# Patient Record
Sex: Female | Born: 1945
Health system: Southern US, Community
[De-identification: ages and names within clinical notes are randomized; demographics above are authoritative.]

## PROBLEM LIST (undated history)

## (undated) DIAGNOSIS — J309 Allergic rhinitis, unspecified: Secondary | ICD-10-CM

## (undated) DIAGNOSIS — L570 Actinic keratosis: Secondary | ICD-10-CM

## (undated) DIAGNOSIS — E785 Hyperlipidemia, unspecified: Secondary | ICD-10-CM

## (undated) DIAGNOSIS — I10 Essential (primary) hypertension: Secondary | ICD-10-CM

## (undated) DIAGNOSIS — M810 Age-related osteoporosis without current pathological fracture: Secondary | ICD-10-CM

## (undated) DIAGNOSIS — H9190 Unspecified hearing loss, unspecified ear: Secondary | ICD-10-CM

## (undated) HISTORY — DX: Allergic rhinitis, unspecified: J30.9

## (undated) HISTORY — DX: Age-related osteoporosis without current pathological fracture: M81.0

## (undated) HISTORY — PX: COLONOSCOPY: SHX174

## (undated) HISTORY — DX: Unspecified hearing loss, unspecified ear: H91.90

## (undated) HISTORY — PX: VAGINAL HYSTERECTOMY: SUR661

## (undated) HISTORY — DX: Hyperlipidemia, unspecified: E78.5

## (undated) HISTORY — DX: Essential (primary) hypertension: I10

## (undated) HISTORY — DX: Actinic keratosis: L57.0

---

## 1999-04-18 ENCOUNTER — Other Ambulatory Visit: Admission: RE | Admit: 1999-04-18 | Discharge: 1999-04-18 | Payer: Self-pay | Admitting: Obstetrics and Gynecology

## 1999-06-20 ENCOUNTER — Encounter: Admission: RE | Admit: 1999-06-20 | Discharge: 1999-06-20 | Payer: Self-pay | Admitting: Obstetrics and Gynecology

## 2000-04-27 ENCOUNTER — Other Ambulatory Visit: Admission: RE | Admit: 2000-04-27 | Discharge: 2000-04-27 | Payer: Self-pay | Admitting: Obstetrics and Gynecology

## 2000-07-02 ENCOUNTER — Encounter: Payer: Self-pay | Admitting: Obstetrics and Gynecology

## 2000-07-02 ENCOUNTER — Encounter: Admission: RE | Admit: 2000-07-02 | Discharge: 2000-07-02 | Payer: Self-pay | Admitting: Obstetrics and Gynecology

## 2001-07-04 ENCOUNTER — Encounter: Payer: Self-pay | Admitting: Obstetrics and Gynecology

## 2001-07-04 ENCOUNTER — Encounter: Admission: RE | Admit: 2001-07-04 | Discharge: 2001-07-04 | Payer: Self-pay | Admitting: Obstetrics and Gynecology

## 2001-07-06 ENCOUNTER — Other Ambulatory Visit: Admission: RE | Admit: 2001-07-06 | Discharge: 2001-07-06 | Payer: Self-pay | Admitting: Obstetrics and Gynecology

## 2002-05-26 ENCOUNTER — Encounter: Payer: Self-pay | Admitting: *Deleted

## 2002-05-26 ENCOUNTER — Ambulatory Visit (HOSPITAL_COMMUNITY): Admission: RE | Admit: 2002-05-26 | Discharge: 2002-05-26 | Payer: Self-pay | Admitting: *Deleted

## 2002-07-27 ENCOUNTER — Encounter: Payer: Self-pay | Admitting: Obstetrics and Gynecology

## 2002-07-27 ENCOUNTER — Encounter: Admission: RE | Admit: 2002-07-27 | Discharge: 2002-07-27 | Payer: Self-pay | Admitting: Obstetrics and Gynecology

## 2003-07-20 ENCOUNTER — Encounter (INDEPENDENT_AMBULATORY_CARE_PROVIDER_SITE_OTHER): Payer: Self-pay | Admitting: Specialist

## 2003-07-20 ENCOUNTER — Ambulatory Visit (HOSPITAL_COMMUNITY): Admission: RE | Admit: 2003-07-20 | Discharge: 2003-07-20 | Payer: Self-pay | Admitting: *Deleted

## 2003-07-31 ENCOUNTER — Encounter: Admission: RE | Admit: 2003-07-31 | Discharge: 2003-07-31 | Payer: Self-pay | Admitting: Obstetrics and Gynecology

## 2004-05-08 ENCOUNTER — Emergency Department (HOSPITAL_COMMUNITY): Admission: EM | Admit: 2004-05-08 | Discharge: 2004-05-08 | Payer: Self-pay | Admitting: Emergency Medicine

## 2004-08-19 ENCOUNTER — Encounter: Admission: RE | Admit: 2004-08-19 | Discharge: 2004-08-19 | Payer: Self-pay | Admitting: Obstetrics and Gynecology

## 2004-09-07 HISTORY — PX: CARPAL TUNNEL RELEASE: SHX101

## 2005-03-27 ENCOUNTER — Emergency Department (HOSPITAL_COMMUNITY): Admission: EM | Admit: 2005-03-27 | Discharge: 2005-03-27 | Payer: Self-pay | Admitting: Emergency Medicine

## 2005-07-23 ENCOUNTER — Ambulatory Visit (HOSPITAL_COMMUNITY): Admission: RE | Admit: 2005-07-23 | Discharge: 2005-07-23 | Payer: Self-pay | Admitting: Orthopedic Surgery

## 2005-07-23 ENCOUNTER — Ambulatory Visit (HOSPITAL_BASED_OUTPATIENT_CLINIC_OR_DEPARTMENT_OTHER): Admission: RE | Admit: 2005-07-23 | Discharge: 2005-07-23 | Payer: Self-pay | Admitting: Orthopedic Surgery

## 2005-10-13 ENCOUNTER — Encounter: Admission: RE | Admit: 2005-10-13 | Discharge: 2005-10-13 | Payer: Self-pay | Admitting: Obstetrics and Gynecology

## 2006-10-14 ENCOUNTER — Encounter: Admission: RE | Admit: 2006-10-14 | Discharge: 2006-10-14 | Payer: Self-pay | Admitting: Obstetrics and Gynecology

## 2007-10-17 ENCOUNTER — Encounter: Admission: RE | Admit: 2007-10-17 | Discharge: 2007-10-17 | Payer: Self-pay | Admitting: Obstetrics and Gynecology

## 2008-10-17 ENCOUNTER — Encounter: Admission: RE | Admit: 2008-10-17 | Discharge: 2008-10-17 | Payer: Self-pay | Admitting: Obstetrics and Gynecology

## 2009-12-03 ENCOUNTER — Encounter: Admission: RE | Admit: 2009-12-03 | Discharge: 2009-12-03 | Payer: Self-pay | Admitting: Obstetrics and Gynecology

## 2010-11-07 ENCOUNTER — Other Ambulatory Visit: Payer: Self-pay | Admitting: Obstetrics and Gynecology

## 2010-11-07 DIAGNOSIS — Z1231 Encounter for screening mammogram for malignant neoplasm of breast: Secondary | ICD-10-CM

## 2010-12-05 ENCOUNTER — Ambulatory Visit
Admission: RE | Admit: 2010-12-05 | Discharge: 2010-12-05 | Disposition: A | Discharge status: Home / Self Care | Payer: BC Managed Care – PPO | Source: Ambulatory Visit | Attending: Obstetrics and Gynecology | Admitting: Obstetrics and Gynecology

## 2010-12-05 DIAGNOSIS — Z1231 Encounter for screening mammogram for malignant neoplasm of breast: Secondary | ICD-10-CM

## 2011-01-23 NOTE — Op Note (Signed)
   NAME:  Ariel Walton, Ariel Walton                         ACCOUNT NO.:  [id number]   MEDICAL RECORD NO.:  [id number]                   PATIENT TYPE:  AMB   LOCATION:  ENDO                                 FACILITY:  [facility]   PHYSICIAN:  Georgiana Spinner, M.D.                 DATE OF BIRTH:  03/03/1946   DATE OF PROCEDURE:  07/20/2003  DATE OF DISCHARGE:                                 OPERATIVE REPORT   PROCEDURE:  Upper endoscopy.   INDICATIONS:  GERD.   ANESTHESIA:  Demerol 50 mg, Versed 5 mg.   DESCRIPTION OF PROCEDURE:  With the patient mildly sedated in the left  lateral decubitus position, the Olympus videoscopic endoscope was inserted  in the mouth and passed under direct vision through the esophagus, which  appeared normal except that there was a question of a short segment of  Barrett's that was biopsied and photographed.  We entered into the stomach.  The fundus, body, antrum, duodenal bulb, and second portion of the duodenum  all appeared normal.  From this point the endoscope was slowly withdrawn,  taking circumferential views of the duodenal mucosa until the endoscope was  placed back into the stomach and placed in retroflexion to view the stomach  from below.  The endoscope was straightened and withdrawn taking  circumferential views of the remaining gastric and esophageal mucosa.  The  patient's vital signs and pulse oximetry remained stable.  The patient  tolerated the procedure well without any apparent complications.   FINDINGS:  Question of Barrett's esophagus, biopsied.   Await biopsy report.  The patient will call me for results and follow up  with me as an outpatient.  Proceed to colonoscopy as planned.                                               Georgiana Spinner, M.D.    GMO/MEDQ  D:  07/20/2003  T:  07/20/2003  Job:  [id number]

## 2011-01-23 NOTE — Op Note (Signed)
Ariel Walton, BASINSKI               ACCOUNT NO.:  [id number]   MEDICAL RECORD NO.:  [id number]          PATIENT TYPE:  AMB   LOCATION:  DSC                          FACILITY:  [facility]   PHYSICIAN:  Rodney A. Mortenson, M.D.DATE OF BIRTH:  31-Jan-1946   DATE OF PROCEDURE:  07/23/2005  DATE OF DISCHARGE:                                 OPERATIVE REPORT   PREOPERATIVE DIAGNOSIS:  Right carpal tunnel.   POSTOPERATIVE DIAGNOSIS:  Right carpal tunnel.   OPERATION PERFORMED:  Release of right carpal tunnel.   SURGEON:  Lenard Galloway. Chaney Malling, M.D.   ANESTHESIA:  Axillary block and general.   DESCRIPTION OF PROCEDURE:  Patient placed on the operating table with  pneumatic tourniquet about the right upper arm.  After satisfactory  anesthesia, the right upper extremity was prepped with DuraPrep and draped  out in the usual manner.  The arm was then wrapped out with an Esmarch and  tourniquet was elevated.  Loupe magnification was used throughout the  operative procedure.  A lazy-S incision starting in the midpalmar space and  carried proximally to the volar wrist crease.  Skin edges were retracted.  The fascia of the forearm was identified and isolated.  The median nerve was  identified and protected.  The large Mayo scissor was placed through the  small incision underneath the transverse ligament but above the median nerve  to protect the median nerve.  Under direct vision, the transverse carpal  ligament was then released off the ulnar border of the carpal canal out into  the midpalmar space.  Complete decompression of the nerve was achieved.  No  significant space occupying lesions were seen.  The wound was then bathed in  Marcaine and the skin edges were closed with 4-0 nylon suture.  Large bulky  compressive dressing was applied.  The patient was then transferred to the  recovery room in excellent condition.  Technically this procedure went  extremely well.   FOLLOW UP:  1.  To my office  next week.  2.  Percocet for pain.           ______________________________  Lenard Galloway. Chaney Malling, M.D.     RAM/MEDQ  D:  07/23/2005  T:  07/23/2005  Job:  [id number]

## 2011-01-23 NOTE — Op Note (Signed)
   NAME:  Ariel Walton, Ariel Walton                         ACCOUNT NO.:  [id number]   MEDICAL RECORD NO.:  [id number]                   PATIENT TYPE:  AMB   LOCATION:  ENDO                                 [facility]ACILITY:  [facility]   PHYSICIAN:  Georgiana Spinner, M.D.                 DATE OF BIRTH:  Oct 19, 1945   DATE OF PROCEDURE:  DATE OF DISCHARGE:                                 OPERATIVE REPORT   PROCEDURE:  Colonoscopy.   ANESTHESIA:  Demerol 20 mg, Versed 2.   INDICATIONS:  Cancer screening.   DESCRIPTION OF PROCEDURE:  With the patient mildly sedated and in the left  lateral decubitus position, the Olympus videoscopic colonoscope was inserted  in the rectum and passed under a tortuous distal sigmoid colon.  Eventually  was able to reach the cecum, identified by ileocecal valve and appendiceal  orifice, both of which were photographed.  From this point,  the colonoscope  was slowly withdrawn, taking circumferential views of the colonic mucosa,  stopping only in the rectum which appeared normal on direct and showed some  hemorrhoid on retroflexed view.  The endoscope was straightened and  withdrawn.  The patient's vital signs and pulse oximetry remained stable.  The patient tolerated the procedure well without apparent complications.   FINDINGS:  Internal hemorrhoids; otherwise unremarkable colonoscopic  examination to the cecum.   PLAN:  See endoscopy for further details.                                               Georgiana Spinner, M.D.    GMO/MEDQ  D:  07/20/2003  T:  07/20/2003  Job:  [id number]

## 2011-12-23 ENCOUNTER — Other Ambulatory Visit: Payer: Self-pay | Admitting: Internal Medicine

## 2011-12-23 DIAGNOSIS — R102 Pelvic and perineal pain: Secondary | ICD-10-CM

## 2012-01-08 ENCOUNTER — Ambulatory Visit
Admission: RE | Admit: 2012-01-08 | Discharge: 2012-01-08 | Disposition: A | Discharge status: Home / Self Care | Payer: BC Managed Care – PPO | Source: Ambulatory Visit | Attending: Internal Medicine | Admitting: Internal Medicine

## 2012-01-08 ENCOUNTER — Ambulatory Visit
Admission: RE | Admit: 2012-01-08 | Discharge: 2012-01-08 | Disposition: A | Discharge status: Home / Self Care | Payer: Medicare Other | Source: Ambulatory Visit | Attending: Internal Medicine | Admitting: Internal Medicine

## 2012-01-08 DIAGNOSIS — R102 Pelvic and perineal pain: Secondary | ICD-10-CM

## 2013-06-27 ENCOUNTER — Encounter: Payer: Self-pay | Admitting: Internal Medicine

## 2013-08-28 ENCOUNTER — Ambulatory Visit (AMBULATORY_SURGERY_CENTER): Payer: Self-pay

## 2013-08-28 VITALS — Ht 63.0 in | Wt 131.0 lb

## 2013-08-28 DIAGNOSIS — Z1211 Encounter for screening for malignant neoplasm of colon: Secondary | ICD-10-CM

## 2013-08-28 MED ORDER — SUPREP BOWEL PREP KIT 17.5-3.13-1.6 GM/177ML PO SOLN: 1.0000 | Freq: Once | ORAL | Status: DC

## 2013-09-13 ENCOUNTER — Ambulatory Visit (AMBULATORY_SURGERY_CENTER): Payer: Medicare HMO | Admitting: Internal Medicine

## 2013-09-13 ENCOUNTER — Encounter: Payer: Self-pay | Admitting: Internal Medicine

## 2013-09-13 VITALS — BP 105/56 | HR 78 | Temp 98.4°F | Resp 18 | Ht 63.0 in | Wt 131.0 lb

## 2013-09-13 DIAGNOSIS — Z1211 Encounter for screening for malignant neoplasm of colon: Secondary | ICD-10-CM

## 2013-09-13 MED ORDER — SODIUM CHLORIDE 0.9 % IV SOLN: 500.0000 mL | INTRAVENOUS | Status: DC

## 2013-09-13 NOTE — Progress Notes (Signed)
A/ox3 pleased with MAC, report to Karol RN 

## 2013-09-13 NOTE — Op Note (Signed)
[place]  [address]. [place], [address]   COLONOSCOPY PROCEDURE REPORT  PATIENT: Ariel Walton, Ariel Walton  MR#: [id number] BIRTHDATE: May 06, 1946 , 67  yrs. old GENDER: Female ENDOSCOPIST: Gatha Mayer, MD, [facility] REFERRED OJ:[id number] Delight Hoh, M.D. PROCEDURE DATE:  09/13/2013 PROCEDURE:   Colonoscopy, screening First Screening Colonoscopy - Avg.  risk and is 50 yrs.  old or older - No.  Prior Negative Screening - Now for repeat screening. 10 or more years since last screening  History of Adenoma - Now for follow-up colonoscopy & has been > or = to 3 yrs.  N/A  Polyps Removed Today? No.  Recommend repeat exam, <10 yrs? No. ASA CLASS:   Class II INDICATIONS:average risk screening and Last colonoscopy performed 10 years ago. MEDICATIONS: Propofol (Diprivan) 180 mg IV, MAC sedation, administered by CRNA, and These medications were titrated to patient response per physician's verbal order  DESCRIPTION OF PROCEDURE:   After the risks benefits and alternatives of the procedure were thoroughly explained, informed consent was obtained.  A digital rectal exam revealed no abnormalities of the rectum.   The LB [id number]  endoscope was introduced through the anus and advanced to the cecum, which was identified by both the appendix and ileocecal valve. No adverse events experienced.   The quality of the prep was excellent using Suprep  The instrument was then slowly withdrawn as the colon was fully examined.      COLON FINDINGS: A normal appearing cecum, ileocecal valve, and appendiceal orifice were identified.  The ascending, hepatic flexure, transverse, splenic flexure, descending, sigmoid colon and rectum appeared unremarkable.  No polyps or cancers were seen.   A right colon retroflexion was performed.  Retroflexed views revealed no abnormalities. The time to cecum=4 minutes 27 seconds. Withdrawal time=8 minutes 11 seconds.  The scope was withdrawn and the  procedure completed. COMPLICATIONS: There were no complications.  ENDOSCOPIC IMPRESSION: Normal colonoscopy - excellent prep second or third screening exam  RECOMMENDATIONS: Follow-up as needed, she has had at least 2 colonoscopies w/o polyps and would be 77 in 10 years so no routine recall needed most likely - reassess with PCP at that time and examine for signs/symptoms as needed   eSigned:  Gatha Mayer, MD, [facility] 09/13/2013 10:50 AM   cc: Alden Server, MD and The Patient

## 2013-09-13 NOTE — Patient Instructions (Signed)
Normal Colonoscopy  YOU HAD AN ENDOSCOPIC PROCEDURE TODAY AT [facility]: Refer to the procedure report that was given to you for any specific questions about what was found during the examination.  If the procedure report does not answer your questions, please call your gastroenterologist to clarify.  If you requested that your care partner not be given the details of your procedure findings, then the procedure report has been included in a sealed envelope for you to review at your convenience later.  YOU SHOULD EXPECT: Some feelings of bloating in the abdomen. Passage of more gas than usual.  Walking can help get rid of the air that was put into your GI tract during the procedure and reduce the bloating. If you had a lower endoscopy (such as a colonoscopy or flexible sigmoidoscopy) you may notice spotting of blood in your stool or on the toilet paper. If you underwent a bowel prep for your procedure, then you may not have a normal bowel movement for a few days.  DIET: Your first meal following the procedure should be a light meal and then it is ok to progress to your normal diet.  A half-sandwich or bowl of soup is an example of a  good first meal.  Heavy or fried foods are harder to digest and may make you feel nauseous or bloated.  Likewise meals heavy in dairy and vegetables can cause extra gas to form and this can also increase the bloating.  Drink plenty of fluids but you should avoid alcoholic beverages for 24 hours.  ACTIVITY: Your care partner should take you home directly after the procedure.  You should plan to take it easy, moving slowly for the rest of the day.  You can resume normal activity the day after the procedure however you should NOT DRIVE or use heavy machinery for 24 hours (because of the sedation medicines used during the test).    SYMPTOMS TO REPORT IMMEDIATELY: A gastroenterologist can be reached at any hour.  During normal business hours, 8:30 AM to 5:00 PM  Monday through Friday, call [phone].  After hours and on weekends, please call the GI answering service at [phone] who will take a message and have the physician on call contact you.   Following lower endoscopy (colonoscopy or flexible sigmoidoscopy):  Excessive amounts of blood in the stool  Significant tenderness or worsening of abdominal pains  Swelling of the abdomen that is new, acute  Fever of 100F or higher  FOLLOW UP: If any biopsies were taken you will be contacted by phone or by letter within the next 1-3 weeks.  Call your gastroenterologist if you have not heard about the biopsies in 3 weeks.  Our staff will call the home number listed on your records the next business day following your procedure to check on you and address any questions or concerns that you may have at that time regarding the information given to you following your procedure. This is a courtesy call and so if there is no answer at the home number and we have not heard from you through the emergency physician on call, we will assume that you have returned to your regular daily activities without incident.  SIGNATURES/CONFIDENTIALITY: You and/or your care partner have signed paperwork which will be entered into your electronic medical record.  These signatures attest to the fact that that the information above on your After Visit Summary has been reviewed and is understood.  Full responsibility of  the confidentiality of this discharge information lies with you and/or your care-partner. 

## 2013-09-14 ENCOUNTER — Telehealth: Payer: Self-pay | Admitting: *Deleted

## 2013-09-14 NOTE — Telephone Encounter (Signed)
  Follow up Call-  Call back number 09/13/2013  Post procedure Call Back phone  # [phone]  Permission to leave phone message No  comments DO NOT HAVE ANSWERING MACHINE     Patient questions:  Do you have a fever, pain , or abdominal swelling? no Pain Score  0 *  Have you tolerated food without any problems? yes  Have you been able to return to your normal activities? yes  Do you have any questions about your discharge instructions: Diet   no Medications  no Follow up visit  no  Do you have questions or concerns about your Care? no  Actions: * If pain score is 4 or above: No action needed, pain <4.

## 2013-11-17 ENCOUNTER — Other Ambulatory Visit: Payer: Self-pay | Admitting: Internal Medicine

## 2013-11-17 ENCOUNTER — Ambulatory Visit
Admission: RE | Admit: 2013-11-17 | Discharge: 2013-11-17 | Disposition: A | Discharge status: Home / Self Care | Payer: Medicare PPO | Source: Ambulatory Visit | Attending: Internal Medicine | Admitting: Internal Medicine

## 2013-11-17 DIAGNOSIS — S0990XA Unspecified injury of head, initial encounter: Secondary | ICD-10-CM

## 2013-11-27 ENCOUNTER — Other Ambulatory Visit: Payer: Self-pay | Admitting: Otolaryngology

## 2013-11-27 DIAGNOSIS — H905 Unspecified sensorineural hearing loss: Secondary | ICD-10-CM

## 2013-11-27 DIAGNOSIS — H903 Sensorineural hearing loss, bilateral: Secondary | ICD-10-CM

## 2013-12-04 ENCOUNTER — Ambulatory Visit
Admission: RE | Admit: 2013-12-04 | Discharge: 2013-12-04 | Disposition: A | Discharge status: Home / Self Care | Payer: Medicare PPO | Source: Ambulatory Visit | Attending: Otolaryngology | Admitting: Otolaryngology

## 2013-12-04 DIAGNOSIS — H905 Unspecified sensorineural hearing loss: Secondary | ICD-10-CM

## 2013-12-04 DIAGNOSIS — H903 Sensorineural hearing loss, bilateral: Secondary | ICD-10-CM

## 2014-10-04 DIAGNOSIS — R29898 Other symptoms and signs involving the musculoskeletal system: Secondary | ICD-10-CM | POA: Diagnosis not present

## 2015-02-19 DIAGNOSIS — Z01419 Encounter for gynecological examination (general) (routine) without abnormal findings: Secondary | ICD-10-CM | POA: Diagnosis not present

## 2015-02-19 DIAGNOSIS — Z1231 Encounter for screening mammogram for malignant neoplasm of breast: Secondary | ICD-10-CM | POA: Diagnosis not present

## 2015-04-08 DIAGNOSIS — M810 Age-related osteoporosis without current pathological fracture: Secondary | ICD-10-CM | POA: Diagnosis not present

## 2015-07-23 DIAGNOSIS — R300 Dysuria: Secondary | ICD-10-CM | POA: Diagnosis not present

## 2015-08-07 DIAGNOSIS — I10 Essential (primary) hypertension: Secondary | ICD-10-CM | POA: Diagnosis not present

## 2015-08-07 DIAGNOSIS — E559 Vitamin D deficiency, unspecified: Secondary | ICD-10-CM | POA: Diagnosis not present

## 2015-08-07 DIAGNOSIS — M810 Age-related osteoporosis without current pathological fracture: Secondary | ICD-10-CM | POA: Diagnosis not present

## 2015-08-07 DIAGNOSIS — E7800 Pure hypercholesterolemia, unspecified: Secondary | ICD-10-CM | POA: Diagnosis not present

## 2015-08-07 DIAGNOSIS — Z0000 Encounter for general adult medical examination without abnormal findings: Secondary | ICD-10-CM | POA: Diagnosis not present

## 2015-08-14 DIAGNOSIS — M810 Age-related osteoporosis without current pathological fracture: Secondary | ICD-10-CM | POA: Diagnosis not present

## 2015-08-14 DIAGNOSIS — I10 Essential (primary) hypertension: Secondary | ICD-10-CM | POA: Diagnosis not present

## 2015-08-14 DIAGNOSIS — E7800 Pure hypercholesterolemia, unspecified: Secondary | ICD-10-CM | POA: Diagnosis not present

## 2015-08-14 DIAGNOSIS — Z23 Encounter for immunization: Secondary | ICD-10-CM | POA: Diagnosis not present

## 2015-10-04 DIAGNOSIS — H9193 Unspecified hearing loss, bilateral: Secondary | ICD-10-CM | POA: Diagnosis not present

## 2015-10-04 DIAGNOSIS — L570 Actinic keratosis: Secondary | ICD-10-CM | POA: Diagnosis not present

## 2015-11-08 DIAGNOSIS — M810 Age-related osteoporosis without current pathological fracture: Secondary | ICD-10-CM | POA: Diagnosis not present

## 2015-11-22 DIAGNOSIS — T5991XA Toxic effect of unspecified gases, fumes and vapors, accidental (unintentional), initial encounter: Secondary | ICD-10-CM | POA: Diagnosis not present

## 2015-11-22 DIAGNOSIS — J069 Acute upper respiratory infection, unspecified: Secondary | ICD-10-CM | POA: Diagnosis not present

## 2016-02-07 DIAGNOSIS — M8580 Other specified disorders of bone density and structure, unspecified site: Secondary | ICD-10-CM | POA: Diagnosis not present

## 2016-02-07 DIAGNOSIS — E7800 Pure hypercholesterolemia, unspecified: Secondary | ICD-10-CM | POA: Diagnosis not present

## 2016-02-07 DIAGNOSIS — E559 Vitamin D deficiency, unspecified: Secondary | ICD-10-CM | POA: Diagnosis not present

## 2016-02-07 DIAGNOSIS — M859 Disorder of bone density and structure, unspecified: Secondary | ICD-10-CM | POA: Diagnosis not present

## 2016-02-07 DIAGNOSIS — Z0000 Encounter for general adult medical examination without abnormal findings: Secondary | ICD-10-CM | POA: Diagnosis not present

## 2016-02-07 DIAGNOSIS — I10 Essential (primary) hypertension: Secondary | ICD-10-CM | POA: Diagnosis not present

## 2016-02-07 DIAGNOSIS — N951 Menopausal and female climacteric states: Secondary | ICD-10-CM | POA: Diagnosis not present

## 2016-02-07 DIAGNOSIS — N390 Urinary tract infection, site not specified: Secondary | ICD-10-CM | POA: Diagnosis not present

## 2016-02-12 DIAGNOSIS — I10 Essential (primary) hypertension: Secondary | ICD-10-CM | POA: Diagnosis not present

## 2016-02-12 DIAGNOSIS — E7800 Pure hypercholesterolemia, unspecified: Secondary | ICD-10-CM | POA: Diagnosis not present

## 2016-02-12 DIAGNOSIS — Z0000 Encounter for general adult medical examination without abnormal findings: Secondary | ICD-10-CM | POA: Diagnosis not present

## 2016-02-12 DIAGNOSIS — M8580 Other specified disorders of bone density and structure, unspecified site: Secondary | ICD-10-CM | POA: Diagnosis not present

## 2016-02-25 DIAGNOSIS — D485 Neoplasm of uncertain behavior of skin: Secondary | ICD-10-CM | POA: Diagnosis not present

## 2016-02-25 DIAGNOSIS — H61002 Unspecified perichondritis of left external ear: Secondary | ICD-10-CM | POA: Diagnosis not present

## 2016-02-25 DIAGNOSIS — L409 Psoriasis, unspecified: Secondary | ICD-10-CM | POA: Diagnosis not present

## 2016-02-25 DIAGNOSIS — L570 Actinic keratosis: Secondary | ICD-10-CM | POA: Diagnosis not present

## 2016-03-11 DIAGNOSIS — Z130 Encounter for screening for diseases of the blood and blood-forming organs and certain disorders involving the immune mechanism: Secondary | ICD-10-CM | POA: Diagnosis not present

## 2016-03-11 DIAGNOSIS — Z01419 Encounter for gynecological examination (general) (routine) without abnormal findings: Secondary | ICD-10-CM | POA: Diagnosis not present

## 2016-03-11 DIAGNOSIS — Z1389 Encounter for screening for other disorder: Secondary | ICD-10-CM | POA: Diagnosis not present

## 2016-03-11 DIAGNOSIS — Z1231 Encounter for screening mammogram for malignant neoplasm of breast: Secondary | ICD-10-CM | POA: Diagnosis not present

## 2016-04-29 DIAGNOSIS — L570 Actinic keratosis: Secondary | ICD-10-CM | POA: Diagnosis not present

## 2016-06-02 DIAGNOSIS — M810 Age-related osteoporosis without current pathological fracture: Secondary | ICD-10-CM | POA: Diagnosis not present

## 2016-07-23 DIAGNOSIS — D485 Neoplasm of uncertain behavior of skin: Secondary | ICD-10-CM | POA: Diagnosis not present

## 2016-07-23 DIAGNOSIS — L821 Other seborrheic keratosis: Secondary | ICD-10-CM | POA: Diagnosis not present

## 2016-07-23 DIAGNOSIS — D1801 Hemangioma of skin and subcutaneous tissue: Secondary | ICD-10-CM | POA: Diagnosis not present

## 2016-07-23 DIAGNOSIS — D225 Melanocytic nevi of trunk: Secondary | ICD-10-CM | POA: Diagnosis not present

## 2016-07-23 DIAGNOSIS — L814 Other melanin hyperpigmentation: Secondary | ICD-10-CM | POA: Diagnosis not present

## 2016-07-23 DIAGNOSIS — L570 Actinic keratosis: Secondary | ICD-10-CM | POA: Diagnosis not present

## 2016-12-01 DIAGNOSIS — M810 Age-related osteoporosis without current pathological fracture: Secondary | ICD-10-CM | POA: Diagnosis not present

## 2016-12-30 DIAGNOSIS — R21 Rash and other nonspecific skin eruption: Secondary | ICD-10-CM | POA: Diagnosis not present

## 2016-12-30 DIAGNOSIS — L299 Pruritus, unspecified: Secondary | ICD-10-CM | POA: Diagnosis not present

## 2017-02-16 DIAGNOSIS — I10 Essential (primary) hypertension: Secondary | ICD-10-CM | POA: Diagnosis not present

## 2017-02-16 DIAGNOSIS — Z0000 Encounter for general adult medical examination without abnormal findings: Secondary | ICD-10-CM | POA: Diagnosis not present

## 2017-02-16 DIAGNOSIS — E559 Vitamin D deficiency, unspecified: Secondary | ICD-10-CM | POA: Diagnosis not present

## 2017-02-19 DIAGNOSIS — I10 Essential (primary) hypertension: Secondary | ICD-10-CM | POA: Diagnosis not present

## 2017-02-19 DIAGNOSIS — Z0000 Encounter for general adult medical examination without abnormal findings: Secondary | ICD-10-CM | POA: Diagnosis not present

## 2017-02-19 DIAGNOSIS — M810 Age-related osteoporosis without current pathological fracture: Secondary | ICD-10-CM | POA: Diagnosis not present

## 2017-02-19 DIAGNOSIS — E7800 Pure hypercholesterolemia, unspecified: Secondary | ICD-10-CM | POA: Diagnosis not present

## 2017-03-12 DIAGNOSIS — Z1389 Encounter for screening for other disorder: Secondary | ICD-10-CM | POA: Diagnosis not present

## 2017-03-12 DIAGNOSIS — Z130 Encounter for screening for diseases of the blood and blood-forming organs and certain disorders involving the immune mechanism: Secondary | ICD-10-CM | POA: Diagnosis not present

## 2017-03-12 DIAGNOSIS — Z1231 Encounter for screening mammogram for malignant neoplasm of breast: Secondary | ICD-10-CM | POA: Diagnosis not present

## 2017-03-12 DIAGNOSIS — Z01419 Encounter for gynecological examination (general) (routine) without abnormal findings: Secondary | ICD-10-CM | POA: Diagnosis not present

## 2017-03-23 DIAGNOSIS — M9902 Segmental and somatic dysfunction of thoracic region: Secondary | ICD-10-CM | POA: Diagnosis not present

## 2017-03-23 DIAGNOSIS — M9901 Segmental and somatic dysfunction of cervical region: Secondary | ICD-10-CM | POA: Diagnosis not present

## 2017-03-23 DIAGNOSIS — M5412 Radiculopathy, cervical region: Secondary | ICD-10-CM | POA: Diagnosis not present

## 2017-03-23 DIAGNOSIS — M546 Pain in thoracic spine: Secondary | ICD-10-CM | POA: Diagnosis not present

## 2017-03-29 DIAGNOSIS — M9902 Segmental and somatic dysfunction of thoracic region: Secondary | ICD-10-CM | POA: Diagnosis not present

## 2017-03-29 DIAGNOSIS — M5412 Radiculopathy, cervical region: Secondary | ICD-10-CM | POA: Diagnosis not present

## 2017-03-29 DIAGNOSIS — M9901 Segmental and somatic dysfunction of cervical region: Secondary | ICD-10-CM | POA: Diagnosis not present

## 2017-03-29 DIAGNOSIS — M546 Pain in thoracic spine: Secondary | ICD-10-CM | POA: Diagnosis not present

## 2017-04-07 DIAGNOSIS — M5412 Radiculopathy, cervical region: Secondary | ICD-10-CM | POA: Diagnosis not present

## 2017-04-07 DIAGNOSIS — M9901 Segmental and somatic dysfunction of cervical region: Secondary | ICD-10-CM | POA: Diagnosis not present

## 2017-04-07 DIAGNOSIS — M9902 Segmental and somatic dysfunction of thoracic region: Secondary | ICD-10-CM | POA: Diagnosis not present

## 2017-04-07 DIAGNOSIS — M546 Pain in thoracic spine: Secondary | ICD-10-CM | POA: Diagnosis not present

## 2017-04-08 DIAGNOSIS — M5412 Radiculopathy, cervical region: Secondary | ICD-10-CM | POA: Diagnosis not present

## 2017-04-08 DIAGNOSIS — M9901 Segmental and somatic dysfunction of cervical region: Secondary | ICD-10-CM | POA: Diagnosis not present

## 2017-04-08 DIAGNOSIS — M9902 Segmental and somatic dysfunction of thoracic region: Secondary | ICD-10-CM | POA: Diagnosis not present

## 2017-04-08 DIAGNOSIS — M546 Pain in thoracic spine: Secondary | ICD-10-CM | POA: Diagnosis not present

## 2017-06-08 DIAGNOSIS — M79672 Pain in left foot: Secondary | ICD-10-CM | POA: Diagnosis not present

## 2017-06-08 DIAGNOSIS — S62642A Nondisplaced fracture of proximal phalanx of right middle finger, initial encounter for closed fracture: Secondary | ICD-10-CM | POA: Diagnosis not present

## 2017-06-09 DIAGNOSIS — M810 Age-related osteoporosis without current pathological fracture: Secondary | ICD-10-CM | POA: Diagnosis not present

## 2017-08-11 DIAGNOSIS — L821 Other seborrheic keratosis: Secondary | ICD-10-CM | POA: Diagnosis not present

## 2017-08-11 DIAGNOSIS — L820 Inflamed seborrheic keratosis: Secondary | ICD-10-CM | POA: Diagnosis not present

## 2017-08-11 DIAGNOSIS — L570 Actinic keratosis: Secondary | ICD-10-CM | POA: Diagnosis not present

## 2017-08-11 DIAGNOSIS — L814 Other melanin hyperpigmentation: Secondary | ICD-10-CM | POA: Diagnosis not present

## 2017-08-11 DIAGNOSIS — D1801 Hemangioma of skin and subcutaneous tissue: Secondary | ICD-10-CM | POA: Diagnosis not present

## 2017-08-18 DIAGNOSIS — E7800 Pure hypercholesterolemia, unspecified: Secondary | ICD-10-CM | POA: Diagnosis not present

## 2017-08-20 DIAGNOSIS — Z23 Encounter for immunization: Secondary | ICD-10-CM | POA: Diagnosis not present

## 2017-08-20 DIAGNOSIS — E7800 Pure hypercholesterolemia, unspecified: Secondary | ICD-10-CM | POA: Diagnosis not present

## 2017-08-20 DIAGNOSIS — I10 Essential (primary) hypertension: Secondary | ICD-10-CM | POA: Diagnosis not present

## 2017-08-20 DIAGNOSIS — M810 Age-related osteoporosis without current pathological fracture: Secondary | ICD-10-CM | POA: Diagnosis not present

## 2017-10-04 DIAGNOSIS — J069 Acute upper respiratory infection, unspecified: Secondary | ICD-10-CM | POA: Diagnosis not present

## 2017-10-04 DIAGNOSIS — J029 Acute pharyngitis, unspecified: Secondary | ICD-10-CM | POA: Diagnosis not present

## 2017-10-25 DIAGNOSIS — J069 Acute upper respiratory infection, unspecified: Secondary | ICD-10-CM | POA: Diagnosis not present

## 2017-12-14 DIAGNOSIS — M810 Age-related osteoporosis without current pathological fracture: Secondary | ICD-10-CM | POA: Diagnosis not present

## 2018-02-18 DIAGNOSIS — I10 Essential (primary) hypertension: Secondary | ICD-10-CM | POA: Diagnosis not present

## 2018-02-18 DIAGNOSIS — N390 Urinary tract infection, site not specified: Secondary | ICD-10-CM | POA: Diagnosis not present

## 2018-02-18 DIAGNOSIS — E559 Vitamin D deficiency, unspecified: Secondary | ICD-10-CM | POA: Diagnosis not present

## 2018-02-18 DIAGNOSIS — E7800 Pure hypercholesterolemia, unspecified: Secondary | ICD-10-CM | POA: Diagnosis not present

## 2018-02-28 DIAGNOSIS — E7800 Pure hypercholesterolemia, unspecified: Secondary | ICD-10-CM | POA: Diagnosis not present

## 2018-02-28 DIAGNOSIS — M8580 Other specified disorders of bone density and structure, unspecified site: Secondary | ICD-10-CM | POA: Diagnosis not present

## 2018-02-28 DIAGNOSIS — I10 Essential (primary) hypertension: Secondary | ICD-10-CM | POA: Diagnosis not present

## 2018-02-28 DIAGNOSIS — Z0000 Encounter for general adult medical examination without abnormal findings: Secondary | ICD-10-CM | POA: Diagnosis not present

## 2018-02-28 DIAGNOSIS — M810 Age-related osteoporosis without current pathological fracture: Secondary | ICD-10-CM | POA: Diagnosis not present

## 2018-06-23 DIAGNOSIS — M810 Age-related osteoporosis without current pathological fracture: Secondary | ICD-10-CM | POA: Diagnosis not present

## 2018-08-02 DIAGNOSIS — H905 Unspecified sensorineural hearing loss: Secondary | ICD-10-CM | POA: Diagnosis not present

## 2018-09-01 DIAGNOSIS — H903 Sensorineural hearing loss, bilateral: Secondary | ICD-10-CM | POA: Diagnosis not present

## 2018-09-01 DIAGNOSIS — E7800 Pure hypercholesterolemia, unspecified: Secondary | ICD-10-CM | POA: Diagnosis not present

## 2018-09-01 DIAGNOSIS — H905 Unspecified sensorineural hearing loss: Secondary | ICD-10-CM | POA: Diagnosis not present

## 2018-09-09 DIAGNOSIS — I10 Essential (primary) hypertension: Secondary | ICD-10-CM | POA: Diagnosis not present

## 2018-09-09 DIAGNOSIS — E7800 Pure hypercholesterolemia, unspecified: Secondary | ICD-10-CM | POA: Diagnosis not present

## 2018-09-09 DIAGNOSIS — Z23 Encounter for immunization: Secondary | ICD-10-CM | POA: Diagnosis not present

## 2018-09-09 DIAGNOSIS — M8580 Other specified disorders of bone density and structure, unspecified site: Secondary | ICD-10-CM | POA: Diagnosis not present

## 2018-10-31 DIAGNOSIS — Z23 Encounter for immunization: Secondary | ICD-10-CM | POA: Diagnosis not present

## 2018-10-31 DIAGNOSIS — D2271 Melanocytic nevi of right lower limb, including hip: Secondary | ICD-10-CM | POA: Diagnosis not present

## 2018-10-31 DIAGNOSIS — L821 Other seborrheic keratosis: Secondary | ICD-10-CM | POA: Diagnosis not present

## 2018-10-31 DIAGNOSIS — L570 Actinic keratosis: Secondary | ICD-10-CM | POA: Diagnosis not present

## 2018-10-31 DIAGNOSIS — L820 Inflamed seborrheic keratosis: Secondary | ICD-10-CM | POA: Diagnosis not present

## 2018-12-29 DIAGNOSIS — M810 Age-related osteoporosis without current pathological fracture: Secondary | ICD-10-CM | POA: Diagnosis not present

## 2019-01-23 DIAGNOSIS — I10 Essential (primary) hypertension: Secondary | ICD-10-CM | POA: Diagnosis not present

## 2019-01-23 DIAGNOSIS — L237 Allergic contact dermatitis due to plants, except food: Secondary | ICD-10-CM | POA: Diagnosis not present

## 2019-03-03 DIAGNOSIS — I10 Essential (primary) hypertension: Secondary | ICD-10-CM | POA: Diagnosis not present

## 2019-03-03 DIAGNOSIS — E7800 Pure hypercholesterolemia, unspecified: Secondary | ICD-10-CM | POA: Diagnosis not present

## 2019-03-06 DIAGNOSIS — I10 Essential (primary) hypertension: Secondary | ICD-10-CM | POA: Diagnosis not present

## 2019-03-06 DIAGNOSIS — Z79890 Hormone replacement therapy (postmenopausal): Secondary | ICD-10-CM | POA: Diagnosis not present

## 2019-03-06 DIAGNOSIS — H9190 Unspecified hearing loss, unspecified ear: Secondary | ICD-10-CM | POA: Diagnosis not present

## 2019-03-06 DIAGNOSIS — J309 Allergic rhinitis, unspecified: Secondary | ICD-10-CM | POA: Diagnosis not present

## 2019-03-06 DIAGNOSIS — M8580 Other specified disorders of bone density and structure, unspecified site: Secondary | ICD-10-CM | POA: Diagnosis not present

## 2019-03-06 DIAGNOSIS — Z803 Family history of malignant neoplasm of breast: Secondary | ICD-10-CM | POA: Diagnosis not present

## 2019-03-06 DIAGNOSIS — I444 Left anterior fascicular block: Secondary | ICD-10-CM | POA: Diagnosis not present

## 2019-03-06 DIAGNOSIS — K219 Gastro-esophageal reflux disease without esophagitis: Secondary | ICD-10-CM | POA: Diagnosis not present

## 2019-03-06 DIAGNOSIS — N390 Urinary tract infection, site not specified: Secondary | ICD-10-CM | POA: Diagnosis not present

## 2019-03-06 DIAGNOSIS — E7800 Pure hypercholesterolemia, unspecified: Secondary | ICD-10-CM | POA: Diagnosis not present

## 2019-03-06 DIAGNOSIS — Z833 Family history of diabetes mellitus: Secondary | ICD-10-CM | POA: Diagnosis not present

## 2019-05-19 DIAGNOSIS — Z03818 Encounter for observation for suspected exposure to other biological agents ruled out: Secondary | ICD-10-CM | POA: Diagnosis not present

## 2019-05-19 DIAGNOSIS — R05 Cough: Secondary | ICD-10-CM | POA: Diagnosis not present

## 2019-05-19 DIAGNOSIS — Z20828 Contact with and (suspected) exposure to other viral communicable diseases: Secondary | ICD-10-CM | POA: Diagnosis not present

## 2019-06-28 ENCOUNTER — Emergency Department (HOSPITAL_COMMUNITY)
Admission: EM | Admit: 2019-06-28 | Discharge: 2019-06-29 | Disposition: A | Discharge status: Home / Self Care | Payer: Medicare HMO | Attending: Emergency Medicine | Admitting: Emergency Medicine

## 2019-06-28 ENCOUNTER — Emergency Department (HOSPITAL_COMMUNITY): Payer: Medicare HMO

## 2019-06-28 ENCOUNTER — Other Ambulatory Visit: Payer: Self-pay

## 2019-06-28 ENCOUNTER — Encounter (HOSPITAL_COMMUNITY): Payer: Self-pay

## 2019-06-28 DIAGNOSIS — R0789 Other chest pain: Secondary | ICD-10-CM

## 2019-06-28 DIAGNOSIS — I10 Essential (primary) hypertension: Secondary | ICD-10-CM | POA: Diagnosis not present

## 2019-06-28 DIAGNOSIS — S2221XA Fracture of manubrium, initial encounter for closed fracture: Secondary | ICD-10-CM | POA: Insufficient documentation

## 2019-06-28 DIAGNOSIS — Y92410 Unspecified street and highway as the place of occurrence of the external cause: Secondary | ICD-10-CM | POA: Diagnosis not present

## 2019-06-28 DIAGNOSIS — S299XXA Unspecified injury of thorax, initial encounter: Secondary | ICD-10-CM | POA: Diagnosis not present

## 2019-06-28 DIAGNOSIS — Z79899 Other long term (current) drug therapy: Secondary | ICD-10-CM | POA: Diagnosis not present

## 2019-06-28 DIAGNOSIS — S2222XA Fracture of body of sternum, initial encounter for closed fracture: Secondary | ICD-10-CM | POA: Diagnosis not present

## 2019-06-28 DIAGNOSIS — Y999 Unspecified external cause status: Secondary | ICD-10-CM | POA: Diagnosis not present

## 2019-06-28 DIAGNOSIS — Y939 Activity, unspecified: Secondary | ICD-10-CM | POA: Diagnosis not present

## 2019-06-28 DIAGNOSIS — R072 Precordial pain: Secondary | ICD-10-CM | POA: Diagnosis not present

## 2019-06-28 LAB — CBC WITH DIFFERENTIAL/PLATELET
Abs Immature Granulocytes: 0.08 10*3/uL — ABNORMAL HIGH (ref 0.00–0.07)
Basophils Absolute: 0.1 10*3/uL (ref 0.0–0.1)
Basophils Relative: 0 %
Eosinophils Absolute: 0.1 10*3/uL (ref 0.0–0.5)
Eosinophils Relative: 1 %
HCT: 40.9 % (ref 36.0–46.0)
Hemoglobin: 13.2 g/dL (ref 12.0–15.0)
Immature Granulocytes: 1 %
Lymphocytes Relative: 13 %
Lymphs Abs: 1.7 10*3/uL (ref 0.7–4.0)
MCH: 28.6 pg (ref 26.0–34.0)
MCHC: 32.3 g/dL (ref 30.0–36.0)
MCV: 88.5 fL (ref 80.0–100.0)
Monocytes Absolute: 1 10*3/uL (ref 0.1–1.0)
Monocytes Relative: 8 %
Neutro Abs: 10.1 10*3/uL — ABNORMAL HIGH (ref 1.7–7.7)
Neutrophils Relative %: 77 %
Platelets: 289 10*3/uL (ref 150–400)
RBC: 4.62 MIL/uL (ref 3.87–5.11)
RDW: 12.7 % (ref 11.5–15.5)
WBC: 13 10*3/uL — ABNORMAL HIGH (ref 4.0–10.5)
nRBC: 0 % (ref 0.0–0.2)

## 2019-06-28 MED ORDER — OXYCODONE-ACETAMINOPHEN 5-325 MG PO TABS
1.0000 | ORAL_TABLET | Freq: Once | ORAL | Status: AC
  Administered 2019-06-28: 1 via ORAL
  Filled 2019-06-28: qty 1
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
## 2019-06-28 NOTE — ED Notes (Signed)
Patient ambulated to radiology.
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
## 2019-06-29 ENCOUNTER — Encounter (HOSPITAL_COMMUNITY): Payer: Self-pay

## 2019-06-29 ENCOUNTER — Emergency Department (HOSPITAL_COMMUNITY): Payer: Medicare HMO

## 2019-06-29 DIAGNOSIS — S2222XA Fracture of body of sternum, initial encounter for closed fracture: Secondary | ICD-10-CM | POA: Diagnosis not present

## 2019-06-29 DIAGNOSIS — S2221XA Fracture of manubrium, initial encounter for closed fracture: Secondary | ICD-10-CM | POA: Diagnosis not present

## 2019-06-29 LAB — COMPREHENSIVE METABOLIC PANEL
ALT: 19 U/L (ref 0–44)
AST: 35 U/L (ref 15–41)
Albumin: 4.4 g/dL (ref 3.5–5.0)
Alkaline Phosphatase: 68 U/L (ref 38–126)
Anion gap: 10 (ref 5–15)
BUN: 22 mg/dL (ref 8–23)
CO2: 27 mmol/L (ref 22–32)
Calcium: 9.5 mg/dL (ref 8.9–10.3)
Chloride: 102 mmol/L (ref 98–111)
Creatinine, Ser: 1.01 mg/dL — ABNORMAL HIGH (ref 0.44–1.00)
GFR calc Af Amer: >60 mL/min (ref >60)
GFR calc non Af Amer: 54 mL/min — ABNORMAL LOW (ref >60)
Glucose, Bld: 117 mg/dL — ABNORMAL HIGH (ref 70–99)
Potassium: 4.1 mmol/L (ref 3.5–5.1)
Sodium: 139 mmol/L (ref 135–145)
Total Bilirubin: 0.4 mg/dL (ref 0.3–1.2)
Total Protein: 7.8 g/dL (ref 6.5–8.1)

## 2019-06-29 MED ORDER — HYDROCODONE-ACETAMINOPHEN 5-325 MG PO TABS: 1.0000 | ORAL_TABLET | ORAL | 0 refills | Status: AC | PRN

## 2019-06-29 MED ORDER — SODIUM CHLORIDE (PF) 0.9 % IJ SOLN
INTRAMUSCULAR | Status: AC
  Filled 2019-06-29: qty 50

## 2019-06-29 MED ORDER — IOHEXOL 300 MG/ML  SOLN
75.0000 mL | Freq: Once | INTRAMUSCULAR | Status: AC | PRN
  Administered 2019-06-29: 02:00:00 75 mL via INTRAVENOUS

## 2019-06-29 NOTE — Discharge Instructions (Addendum)
Do not take more than 2400 mg Ibuprofen daily.

## 2019-07-04 DIAGNOSIS — S2221XD Fracture of manubrium, subsequent encounter for fracture with routine healing: Secondary | ICD-10-CM | POA: Diagnosis not present

## 2019-07-04 DIAGNOSIS — M810 Age-related osteoporosis without current pathological fracture: Secondary | ICD-10-CM | POA: Diagnosis not present

## 2019-09-06 DIAGNOSIS — Z1389 Encounter for screening for other disorder: Secondary | ICD-10-CM | POA: Diagnosis not present

## 2019-09-06 DIAGNOSIS — N390 Urinary tract infection, site not specified: Secondary | ICD-10-CM | POA: Diagnosis not present

## 2019-09-06 DIAGNOSIS — Z01419 Encounter for gynecological examination (general) (routine) without abnormal findings: Secondary | ICD-10-CM | POA: Diagnosis not present

## 2019-09-06 DIAGNOSIS — E669 Obesity, unspecified: Secondary | ICD-10-CM | POA: Diagnosis not present

## 2019-09-06 DIAGNOSIS — Z1231 Encounter for screening mammogram for malignant neoplasm of breast: Secondary | ICD-10-CM | POA: Diagnosis not present

## 2019-09-06 DIAGNOSIS — Z130 Encounter for screening for diseases of the blood and blood-forming organs and certain disorders involving the immune mechanism: Secondary | ICD-10-CM | POA: Diagnosis not present

## 2019-09-13 ENCOUNTER — Ambulatory Visit: Payer: Medicare HMO | Attending: Internal Medicine

## 2019-09-13 ENCOUNTER — Other Ambulatory Visit: Payer: Self-pay

## 2019-09-13 DIAGNOSIS — D485 Neoplasm of uncertain behavior of skin: Secondary | ICD-10-CM | POA: Diagnosis not present

## 2019-09-13 DIAGNOSIS — Z20822 Contact with and (suspected) exposure to covid-19: Secondary | ICD-10-CM | POA: Diagnosis not present

## 2019-09-13 DIAGNOSIS — D0472 Carcinoma in situ of skin of left lower limb, including hip: Secondary | ICD-10-CM | POA: Diagnosis not present

## 2019-09-13 DIAGNOSIS — Z23 Encounter for immunization: Secondary | ICD-10-CM | POA: Diagnosis not present

## 2019-09-13 DIAGNOSIS — H61002 Unspecified perichondritis of left external ear: Secondary | ICD-10-CM | POA: Diagnosis not present

## 2019-09-15 LAB — NOVEL CORONAVIRUS, NAA: SARS-CoV-2, NAA: DETECTED — AB

## 2019-09-16 ENCOUNTER — Telehealth: Payer: Self-pay

## 2019-09-16 NOTE — Telephone Encounter (Signed)
Pt given Covid-19 positive results. Discussed mild, moderate and severe symptoms. Advised pt to call 911 for any respiratory issues and/dehydration. Discussed non test criteria for ending self isolation. Pt advised of way to manage symptoms at home and review isolation precautions especially the importance of washing hands frequently and wearing a mask when around others. Pt verbalized understanding. Will notify HD.

## 2019-09-22 ENCOUNTER — Ambulatory Visit: Payer: Medicare HMO | Attending: Internal Medicine

## 2019-09-22 DIAGNOSIS — Z20822 Contact with and (suspected) exposure to covid-19: Secondary | ICD-10-CM | POA: Diagnosis not present

## 2019-09-23 LAB — NOVEL CORONAVIRUS, NAA: SARS-CoV-2, NAA: DETECTED — AB

## 2019-09-27 DIAGNOSIS — M791 Myalgia, unspecified site: Secondary | ICD-10-CM | POA: Diagnosis not present

## 2019-09-27 DIAGNOSIS — I10 Essential (primary) hypertension: Secondary | ICD-10-CM | POA: Diagnosis not present

## 2019-09-27 DIAGNOSIS — R209 Unspecified disturbances of skin sensation: Secondary | ICD-10-CM | POA: Diagnosis not present

## 2019-10-04 ENCOUNTER — Ambulatory Visit: Payer: Medicare HMO | Attending: Internal Medicine

## 2019-10-04 DIAGNOSIS — Z20822 Contact with and (suspected) exposure to covid-19: Secondary | ICD-10-CM

## 2019-10-05 LAB — NOVEL CORONAVIRUS, NAA: SARS-CoV-2, NAA: NOT DETECTED

## 2019-10-12 DIAGNOSIS — R209 Unspecified disturbances of skin sensation: Secondary | ICD-10-CM | POA: Diagnosis not present

## 2019-10-19 DIAGNOSIS — D0472 Carcinoma in situ of skin of left lower limb, including hip: Secondary | ICD-10-CM | POA: Diagnosis not present

## 2019-11-01 DIAGNOSIS — L578 Other skin changes due to chronic exposure to nonionizing radiation: Secondary | ICD-10-CM | POA: Diagnosis not present

## 2019-11-01 DIAGNOSIS — D2361 Other benign neoplasm of skin of right upper limb, including shoulder: Secondary | ICD-10-CM | POA: Diagnosis not present

## 2019-11-01 DIAGNOSIS — D1801 Hemangioma of skin and subcutaneous tissue: Secondary | ICD-10-CM | POA: Diagnosis not present

## 2019-11-01 DIAGNOSIS — L570 Actinic keratosis: Secondary | ICD-10-CM | POA: Diagnosis not present

## 2019-11-01 DIAGNOSIS — L821 Other seborrheic keratosis: Secondary | ICD-10-CM | POA: Diagnosis not present

## 2019-11-01 DIAGNOSIS — D2271 Melanocytic nevi of right lower limb, including hip: Secondary | ICD-10-CM | POA: Diagnosis not present

## 2019-11-01 DIAGNOSIS — L219 Seborrheic dermatitis, unspecified: Secondary | ICD-10-CM | POA: Diagnosis not present

## 2019-11-01 DIAGNOSIS — D2261 Melanocytic nevi of right upper limb, including shoulder: Secondary | ICD-10-CM | POA: Diagnosis not present

## 2019-11-01 DIAGNOSIS — D485 Neoplasm of uncertain behavior of skin: Secondary | ICD-10-CM | POA: Diagnosis not present

## 2019-11-01 DIAGNOSIS — Z86007 Personal history of in-situ neoplasm of skin: Secondary | ICD-10-CM | POA: Diagnosis not present

## 2019-11-06 DIAGNOSIS — Z01419 Encounter for gynecological examination (general) (routine) without abnormal findings: Secondary | ICD-10-CM | POA: Diagnosis not present

## 2019-11-10 DIAGNOSIS — M542 Cervicalgia: Secondary | ICD-10-CM | POA: Diagnosis not present

## 2019-11-23 DIAGNOSIS — D239 Other benign neoplasm of skin, unspecified: Secondary | ICD-10-CM | POA: Diagnosis not present

## 2019-11-23 DIAGNOSIS — L905 Scar conditions and fibrosis of skin: Secondary | ICD-10-CM | POA: Diagnosis not present

## 2019-11-23 DIAGNOSIS — H61002 Unspecified perichondritis of left external ear: Secondary | ICD-10-CM | POA: Diagnosis not present

## 2019-11-23 DIAGNOSIS — D485 Neoplasm of uncertain behavior of skin: Secondary | ICD-10-CM | POA: Diagnosis not present

## 2020-01-03 DIAGNOSIS — M810 Age-related osteoporosis without current pathological fracture: Secondary | ICD-10-CM | POA: Diagnosis not present

## 2020-03-13 DIAGNOSIS — N390 Urinary tract infection, site not specified: Secondary | ICD-10-CM | POA: Diagnosis not present

## 2020-03-13 DIAGNOSIS — E7800 Pure hypercholesterolemia, unspecified: Secondary | ICD-10-CM | POA: Diagnosis not present

## 2020-03-13 DIAGNOSIS — I10 Essential (primary) hypertension: Secondary | ICD-10-CM | POA: Diagnosis not present

## 2020-03-18 DIAGNOSIS — I444 Left anterior fascicular block: Secondary | ICD-10-CM | POA: Diagnosis not present

## 2020-03-18 DIAGNOSIS — I10 Essential (primary) hypertension: Secondary | ICD-10-CM | POA: Diagnosis not present

## 2020-03-18 DIAGNOSIS — J309 Allergic rhinitis, unspecified: Secondary | ICD-10-CM | POA: Diagnosis not present

## 2020-03-18 DIAGNOSIS — I700 Atherosclerosis of aorta: Secondary | ICD-10-CM | POA: Diagnosis not present

## 2020-03-18 DIAGNOSIS — H9190 Unspecified hearing loss, unspecified ear: Secondary | ICD-10-CM | POA: Diagnosis not present

## 2020-03-18 DIAGNOSIS — K219 Gastro-esophageal reflux disease without esophagitis: Secondary | ICD-10-CM | POA: Diagnosis not present

## 2020-03-18 DIAGNOSIS — E7800 Pure hypercholesterolemia, unspecified: Secondary | ICD-10-CM | POA: Diagnosis not present

## 2020-03-18 DIAGNOSIS — M8580 Other specified disorders of bone density and structure, unspecified site: Secondary | ICD-10-CM | POA: Diagnosis not present

## 2020-03-18 DIAGNOSIS — Z0000 Encounter for general adult medical examination without abnormal findings: Secondary | ICD-10-CM | POA: Diagnosis not present

## 2020-03-27 DIAGNOSIS — Z23 Encounter for immunization: Secondary | ICD-10-CM | POA: Diagnosis not present

## 2020-06-24 DIAGNOSIS — B079 Viral wart, unspecified: Secondary | ICD-10-CM | POA: Diagnosis not present

## 2020-06-24 DIAGNOSIS — D485 Neoplasm of uncertain behavior of skin: Secondary | ICD-10-CM | POA: Diagnosis not present

## 2020-07-08 DIAGNOSIS — M810 Age-related osteoporosis without current pathological fracture: Secondary | ICD-10-CM | POA: Diagnosis not present

## 2020-09-09 DIAGNOSIS — Z01419 Encounter for gynecological examination (general) (routine) without abnormal findings: Secondary | ICD-10-CM | POA: Diagnosis not present

## 2020-09-09 DIAGNOSIS — Z1231 Encounter for screening mammogram for malignant neoplasm of breast: Secondary | ICD-10-CM | POA: Diagnosis not present

## 2020-09-09 DIAGNOSIS — R8290 Unspecified abnormal findings in urine: Secondary | ICD-10-CM | POA: Diagnosis not present

## 2020-10-05 DIAGNOSIS — I10 Essential (primary) hypertension: Secondary | ICD-10-CM | POA: Diagnosis not present

## 2020-10-05 DIAGNOSIS — E7800 Pure hypercholesterolemia, unspecified: Secondary | ICD-10-CM | POA: Diagnosis not present

## 2020-10-05 DIAGNOSIS — K219 Gastro-esophageal reflux disease without esophagitis: Secondary | ICD-10-CM | POA: Diagnosis not present

## 2020-10-05 DIAGNOSIS — M8580 Other specified disorders of bone density and structure, unspecified site: Secondary | ICD-10-CM | POA: Diagnosis not present

## 2020-11-04 DIAGNOSIS — I10 Essential (primary) hypertension: Secondary | ICD-10-CM | POA: Diagnosis not present

## 2020-11-04 DIAGNOSIS — E7800 Pure hypercholesterolemia, unspecified: Secondary | ICD-10-CM | POA: Diagnosis not present

## 2020-11-04 DIAGNOSIS — M8580 Other specified disorders of bone density and structure, unspecified site: Secondary | ICD-10-CM | POA: Diagnosis not present

## 2020-12-12 DIAGNOSIS — L570 Actinic keratosis: Secondary | ICD-10-CM | POA: Diagnosis not present

## 2020-12-12 DIAGNOSIS — D2271 Melanocytic nevi of right lower limb, including hip: Secondary | ICD-10-CM | POA: Diagnosis not present

## 2020-12-12 DIAGNOSIS — Z86007 Personal history of in-situ neoplasm of skin: Secondary | ICD-10-CM | POA: Diagnosis not present

## 2020-12-12 DIAGNOSIS — L578 Other skin changes due to chronic exposure to nonionizing radiation: Secondary | ICD-10-CM | POA: Diagnosis not present

## 2020-12-12 DIAGNOSIS — L219 Seborrheic dermatitis, unspecified: Secondary | ICD-10-CM | POA: Diagnosis not present

## 2020-12-12 DIAGNOSIS — D1801 Hemangioma of skin and subcutaneous tissue: Secondary | ICD-10-CM | POA: Diagnosis not present

## 2020-12-12 DIAGNOSIS — L821 Other seborrheic keratosis: Secondary | ICD-10-CM | POA: Diagnosis not present

## 2020-12-16 DIAGNOSIS — E7800 Pure hypercholesterolemia, unspecified: Secondary | ICD-10-CM | POA: Diagnosis not present

## 2020-12-16 DIAGNOSIS — R519 Headache, unspecified: Secondary | ICD-10-CM | POA: Diagnosis not present

## 2020-12-24 DIAGNOSIS — H18593 Other hereditary corneal dystrophies, bilateral: Secondary | ICD-10-CM | POA: Diagnosis not present

## 2020-12-24 DIAGNOSIS — H2511 Age-related nuclear cataract, right eye: Secondary | ICD-10-CM | POA: Diagnosis not present

## 2020-12-24 DIAGNOSIS — H25043 Posterior subcapsular polar age-related cataract, bilateral: Secondary | ICD-10-CM | POA: Diagnosis not present

## 2020-12-24 DIAGNOSIS — H2513 Age-related nuclear cataract, bilateral: Secondary | ICD-10-CM | POA: Diagnosis not present

## 2020-12-24 DIAGNOSIS — H35371 Puckering of macula, right eye: Secondary | ICD-10-CM | POA: Diagnosis not present

## 2020-12-24 DIAGNOSIS — H18413 Arcus senilis, bilateral: Secondary | ICD-10-CM | POA: Diagnosis not present

## 2021-01-04 DIAGNOSIS — M8580 Other specified disorders of bone density and structure, unspecified site: Secondary | ICD-10-CM | POA: Diagnosis not present

## 2021-01-04 DIAGNOSIS — E7800 Pure hypercholesterolemia, unspecified: Secondary | ICD-10-CM | POA: Diagnosis not present

## 2021-01-04 DIAGNOSIS — I10 Essential (primary) hypertension: Secondary | ICD-10-CM | POA: Diagnosis not present

## 2021-01-08 DIAGNOSIS — M810 Age-related osteoporosis without current pathological fracture: Secondary | ICD-10-CM | POA: Diagnosis not present

## 2021-01-21 DIAGNOSIS — H43813 Vitreous degeneration, bilateral: Secondary | ICD-10-CM | POA: Diagnosis not present

## 2021-01-21 DIAGNOSIS — H3581 Retinal edema: Secondary | ICD-10-CM | POA: Diagnosis not present

## 2021-01-21 DIAGNOSIS — H35371 Puckering of macula, right eye: Secondary | ICD-10-CM | POA: Diagnosis not present

## 2021-02-17 DIAGNOSIS — H2513 Age-related nuclear cataract, bilateral: Secondary | ICD-10-CM | POA: Diagnosis not present

## 2021-02-17 DIAGNOSIS — H35371 Puckering of macula, right eye: Secondary | ICD-10-CM | POA: Diagnosis not present

## 2021-02-17 DIAGNOSIS — H3581 Retinal edema: Secondary | ICD-10-CM | POA: Diagnosis not present

## 2021-02-17 DIAGNOSIS — H2511 Age-related nuclear cataract, right eye: Secondary | ICD-10-CM | POA: Diagnosis not present

## 2021-02-18 DIAGNOSIS — H35371 Puckering of macula, right eye: Secondary | ICD-10-CM | POA: Diagnosis not present

## 2021-02-26 DIAGNOSIS — H3581 Retinal edema: Secondary | ICD-10-CM | POA: Diagnosis not present

## 2021-02-26 DIAGNOSIS — H35371 Puckering of macula, right eye: Secondary | ICD-10-CM | POA: Diagnosis not present

## 2021-03-06 DIAGNOSIS — M8580 Other specified disorders of bone density and structure, unspecified site: Secondary | ICD-10-CM | POA: Diagnosis not present

## 2021-03-06 DIAGNOSIS — I10 Essential (primary) hypertension: Secondary | ICD-10-CM | POA: Diagnosis not present

## 2021-03-06 DIAGNOSIS — E7800 Pure hypercholesterolemia, unspecified: Secondary | ICD-10-CM | POA: Diagnosis not present

## 2021-03-17 DIAGNOSIS — H2513 Age-related nuclear cataract, bilateral: Secondary | ICD-10-CM | POA: Diagnosis not present

## 2021-03-17 DIAGNOSIS — H2512 Age-related nuclear cataract, left eye: Secondary | ICD-10-CM | POA: Diagnosis not present

## 2021-03-19 DIAGNOSIS — H35371 Puckering of macula, right eye: Secondary | ICD-10-CM | POA: Diagnosis not present

## 2021-03-19 DIAGNOSIS — H3581 Retinal edema: Secondary | ICD-10-CM | POA: Diagnosis not present

## 2021-03-19 DIAGNOSIS — H43813 Vitreous degeneration, bilateral: Secondary | ICD-10-CM | POA: Diagnosis not present

## 2021-03-20 DIAGNOSIS — E7801 Familial hypercholesterolemia: Secondary | ICD-10-CM | POA: Diagnosis not present

## 2021-03-20 DIAGNOSIS — M810 Age-related osteoporosis without current pathological fracture: Secondary | ICD-10-CM | POA: Diagnosis not present

## 2021-03-20 DIAGNOSIS — I10 Essential (primary) hypertension: Secondary | ICD-10-CM | POA: Diagnosis not present

## 2021-03-24 ENCOUNTER — Other Ambulatory Visit: Payer: Self-pay | Admitting: Internal Medicine

## 2021-03-24 DIAGNOSIS — E7800 Pure hypercholesterolemia, unspecified: Secondary | ICD-10-CM | POA: Diagnosis not present

## 2021-03-24 DIAGNOSIS — I10 Essential (primary) hypertension: Secondary | ICD-10-CM | POA: Diagnosis not present

## 2021-03-24 DIAGNOSIS — M810 Age-related osteoporosis without current pathological fracture: Secondary | ICD-10-CM | POA: Diagnosis not present

## 2021-03-24 DIAGNOSIS — Z0000 Encounter for general adult medical examination without abnormal findings: Secondary | ICD-10-CM | POA: Diagnosis not present

## 2021-03-24 DIAGNOSIS — I700 Atherosclerosis of aorta: Secondary | ICD-10-CM | POA: Diagnosis not present

## 2021-04-14 ENCOUNTER — Other Ambulatory Visit: Payer: Self-pay

## 2021-04-14 ENCOUNTER — Ambulatory Visit
Admission: RE | Admit: 2021-04-14 | Discharge: 2021-04-14 | Disposition: A | Discharge status: Home / Self Care | Payer: No Typology Code available for payment source | Source: Ambulatory Visit | Attending: Internal Medicine | Admitting: Internal Medicine

## 2021-04-14 DIAGNOSIS — I10 Essential (primary) hypertension: Secondary | ICD-10-CM

## 2021-04-14 DIAGNOSIS — I700 Atherosclerosis of aorta: Secondary | ICD-10-CM | POA: Diagnosis not present

## 2021-04-14 DIAGNOSIS — E7800 Pure hypercholesterolemia, unspecified: Secondary | ICD-10-CM

## 2021-04-17 DIAGNOSIS — M8589 Other specified disorders of bone density and structure, multiple sites: Secondary | ICD-10-CM | POA: Diagnosis not present

## 2021-04-17 DIAGNOSIS — I700 Atherosclerosis of aorta: Secondary | ICD-10-CM | POA: Diagnosis not present

## 2021-04-17 DIAGNOSIS — M810 Age-related osteoporosis without current pathological fracture: Secondary | ICD-10-CM | POA: Diagnosis not present

## 2021-04-17 DIAGNOSIS — Z0100 Encounter for examination of eyes and vision without abnormal findings: Secondary | ICD-10-CM | POA: Diagnosis not present

## 2021-06-06 DIAGNOSIS — M8580 Other specified disorders of bone density and structure, unspecified site: Secondary | ICD-10-CM | POA: Diagnosis not present

## 2021-06-06 DIAGNOSIS — I10 Essential (primary) hypertension: Secondary | ICD-10-CM | POA: Diagnosis not present

## 2021-06-06 DIAGNOSIS — E7800 Pure hypercholesterolemia, unspecified: Secondary | ICD-10-CM | POA: Diagnosis not present

## 2021-07-14 DIAGNOSIS — M542 Cervicalgia: Secondary | ICD-10-CM | POA: Diagnosis not present

## 2021-07-14 DIAGNOSIS — M25542 Pain in joints of left hand: Secondary | ICD-10-CM | POA: Diagnosis not present

## 2021-07-14 DIAGNOSIS — M810 Age-related osteoporosis without current pathological fracture: Secondary | ICD-10-CM | POA: Diagnosis not present

## 2021-07-14 DIAGNOSIS — M25541 Pain in joints of right hand: Secondary | ICD-10-CM | POA: Diagnosis not present

## 2021-07-21 DIAGNOSIS — M25541 Pain in joints of right hand: Secondary | ICD-10-CM | POA: Diagnosis not present

## 2021-07-21 DIAGNOSIS — L818 Other specified disorders of pigmentation: Secondary | ICD-10-CM | POA: Diagnosis not present

## 2021-07-21 DIAGNOSIS — Z1283 Encounter for screening for malignant neoplasm of skin: Secondary | ICD-10-CM | POA: Diagnosis not present

## 2021-07-21 DIAGNOSIS — M542 Cervicalgia: Secondary | ICD-10-CM | POA: Diagnosis not present

## 2021-07-21 DIAGNOSIS — L570 Actinic keratosis: Secondary | ICD-10-CM | POA: Diagnosis not present

## 2021-07-21 DIAGNOSIS — L820 Inflamed seborrheic keratosis: Secondary | ICD-10-CM | POA: Diagnosis not present

## 2021-07-21 DIAGNOSIS — X32XXXA Exposure to sunlight, initial encounter: Secondary | ICD-10-CM | POA: Diagnosis not present

## 2021-07-21 DIAGNOSIS — M25542 Pain in joints of left hand: Secondary | ICD-10-CM | POA: Diagnosis not present

## 2021-07-28 DIAGNOSIS — Z23 Encounter for immunization: Secondary | ICD-10-CM | POA: Diagnosis not present

## 2021-07-28 DIAGNOSIS — Z5189 Encounter for other specified aftercare: Secondary | ICD-10-CM | POA: Diagnosis not present

## 2021-08-06 DIAGNOSIS — M79645 Pain in left finger(s): Secondary | ICD-10-CM | POA: Diagnosis not present

## 2021-08-06 DIAGNOSIS — M65342 Trigger finger, left ring finger: Secondary | ICD-10-CM | POA: Diagnosis not present

## 2021-09-05 DIAGNOSIS — E7800 Pure hypercholesterolemia, unspecified: Secondary | ICD-10-CM | POA: Diagnosis not present

## 2021-09-05 DIAGNOSIS — I10 Essential (primary) hypertension: Secondary | ICD-10-CM | POA: Diagnosis not present

## 2021-09-05 DIAGNOSIS — M8580 Other specified disorders of bone density and structure, unspecified site: Secondary | ICD-10-CM | POA: Diagnosis not present

## 2021-09-10 DIAGNOSIS — Z1231 Encounter for screening mammogram for malignant neoplasm of breast: Secondary | ICD-10-CM | POA: Diagnosis not present

## 2021-09-10 DIAGNOSIS — Z01419 Encounter for gynecological examination (general) (routine) without abnormal findings: Secondary | ICD-10-CM | POA: Diagnosis not present

## 2021-09-10 DIAGNOSIS — N941 Unspecified dyspareunia: Secondary | ICD-10-CM | POA: Diagnosis not present

## 2021-11-05 DIAGNOSIS — M65342 Trigger finger, left ring finger: Secondary | ICD-10-CM | POA: Diagnosis not present

## 2022-01-05 DIAGNOSIS — S161XXA Strain of muscle, fascia and tendon at neck level, initial encounter: Secondary | ICD-10-CM | POA: Diagnosis not present

## 2022-01-12 DIAGNOSIS — M810 Age-related osteoporosis without current pathological fracture: Secondary | ICD-10-CM | POA: Diagnosis not present

## 2022-03-23 DIAGNOSIS — I10 Essential (primary) hypertension: Secondary | ICD-10-CM | POA: Diagnosis not present

## 2022-03-23 DIAGNOSIS — E7801 Familial hypercholesterolemia: Secondary | ICD-10-CM | POA: Diagnosis not present

## 2022-03-23 DIAGNOSIS — M810 Age-related osteoporosis without current pathological fracture: Secondary | ICD-10-CM | POA: Diagnosis not present

## 2022-04-02 DIAGNOSIS — Z0000 Encounter for general adult medical examination without abnormal findings: Secondary | ICD-10-CM | POA: Diagnosis not present

## 2022-04-02 DIAGNOSIS — I10 Essential (primary) hypertension: Secondary | ICD-10-CM | POA: Diagnosis not present

## 2022-04-02 DIAGNOSIS — M810 Age-related osteoporosis without current pathological fracture: Secondary | ICD-10-CM | POA: Diagnosis not present

## 2022-04-02 DIAGNOSIS — I700 Atherosclerosis of aorta: Secondary | ICD-10-CM | POA: Diagnosis not present

## 2022-04-02 DIAGNOSIS — E7800 Pure hypercholesterolemia, unspecified: Secondary | ICD-10-CM | POA: Diagnosis not present

## 2022-04-06 DIAGNOSIS — L570 Actinic keratosis: Secondary | ICD-10-CM | POA: Diagnosis not present

## 2022-04-06 DIAGNOSIS — X32XXXD Exposure to sunlight, subsequent encounter: Secondary | ICD-10-CM | POA: Diagnosis not present

## 2022-04-06 DIAGNOSIS — L820 Inflamed seborrheic keratosis: Secondary | ICD-10-CM | POA: Diagnosis not present

## 2022-07-22 DIAGNOSIS — M810 Age-related osteoporosis without current pathological fracture: Secondary | ICD-10-CM | POA: Diagnosis not present

## 2022-07-29 DIAGNOSIS — L820 Inflamed seborrheic keratosis: Secondary | ICD-10-CM | POA: Diagnosis not present

## 2022-07-29 DIAGNOSIS — L821 Other seborrheic keratosis: Secondary | ICD-10-CM | POA: Diagnosis not present

## 2022-07-29 DIAGNOSIS — D225 Melanocytic nevi of trunk: Secondary | ICD-10-CM | POA: Diagnosis not present

## 2022-07-29 DIAGNOSIS — D485 Neoplasm of uncertain behavior of skin: Secondary | ICD-10-CM | POA: Diagnosis not present

## 2022-09-23 DIAGNOSIS — H903 Sensorineural hearing loss, bilateral: Secondary | ICD-10-CM | POA: Diagnosis not present

## 2022-09-24 DIAGNOSIS — M65342 Trigger finger, left ring finger: Secondary | ICD-10-CM | POA: Diagnosis not present

## 2022-09-28 DIAGNOSIS — M65342 Trigger finger, left ring finger: Secondary | ICD-10-CM | POA: Diagnosis not present

## 2022-10-02 DIAGNOSIS — Z0000 Encounter for general adult medical examination without abnormal findings: Secondary | ICD-10-CM | POA: Diagnosis not present

## 2022-10-02 DIAGNOSIS — I10 Essential (primary) hypertension: Secondary | ICD-10-CM | POA: Diagnosis not present

## 2022-10-02 DIAGNOSIS — E7800 Pure hypercholesterolemia, unspecified: Secondary | ICD-10-CM | POA: Diagnosis not present

## 2022-11-06 DIAGNOSIS — Z1231 Encounter for screening mammogram for malignant neoplasm of breast: Secondary | ICD-10-CM | POA: Diagnosis not present

## 2022-11-06 DIAGNOSIS — Z01419 Encounter for gynecological examination (general) (routine) without abnormal findings: Secondary | ICD-10-CM | POA: Diagnosis not present

## 2022-11-12 DIAGNOSIS — M25642 Stiffness of left hand, not elsewhere classified: Secondary | ICD-10-CM | POA: Diagnosis not present

## 2022-11-19 DIAGNOSIS — M25642 Stiffness of left hand, not elsewhere classified: Secondary | ICD-10-CM | POA: Diagnosis not present

## 2022-12-25 DIAGNOSIS — L570 Actinic keratosis: Secondary | ICD-10-CM | POA: Diagnosis not present

## 2022-12-25 DIAGNOSIS — X32XXXD Exposure to sunlight, subsequent encounter: Secondary | ICD-10-CM | POA: Diagnosis not present

## 2023-01-21 DIAGNOSIS — M810 Age-related osteoporosis without current pathological fracture: Secondary | ICD-10-CM | POA: Diagnosis not present

## 2023-04-05 DIAGNOSIS — R350 Frequency of micturition: Secondary | ICD-10-CM | POA: Diagnosis not present

## 2023-04-05 DIAGNOSIS — I10 Essential (primary) hypertension: Secondary | ICD-10-CM | POA: Diagnosis not present

## 2023-04-05 DIAGNOSIS — E7800 Pure hypercholesterolemia, unspecified: Secondary | ICD-10-CM | POA: Diagnosis not present

## 2023-04-05 DIAGNOSIS — E559 Vitamin D deficiency, unspecified: Secondary | ICD-10-CM | POA: Diagnosis not present

## 2023-04-21 DIAGNOSIS — Z0000 Encounter for general adult medical examination without abnormal findings: Secondary | ICD-10-CM | POA: Diagnosis not present

## 2023-04-21 DIAGNOSIS — E7800 Pure hypercholesterolemia, unspecified: Secondary | ICD-10-CM | POA: Diagnosis not present

## 2023-04-21 DIAGNOSIS — Z23 Encounter for immunization: Secondary | ICD-10-CM | POA: Diagnosis not present

## 2023-04-21 DIAGNOSIS — M810 Age-related osteoporosis without current pathological fracture: Secondary | ICD-10-CM | POA: Diagnosis not present

## 2023-04-21 DIAGNOSIS — E559 Vitamin D deficiency, unspecified: Secondary | ICD-10-CM | POA: Diagnosis not present

## 2023-04-21 DIAGNOSIS — I10 Essential (primary) hypertension: Secondary | ICD-10-CM | POA: Diagnosis not present

## 2023-05-14 IMAGING — CT CT CARDIAC CORONARY ARTERY CALCIUM SCORE
3 series · 14 of 20 positions shown, 16 images · non-contrast
Comparison: 06/29/2019

CLINICAL DATA: 75-year-old white female with pure
hypercholesterolemia, unspecified. Hypertension, unspecified type.

EXAM:
CT CARDIAC CORONARY ARTERY CALCIUM SCORE
TECHNIQUE: Non-contrast imaging through the heart was performed using
prospective ECG gating. Image post processing was performed on an
independent workstation, allowing for quantitative analysis of the
heart and coronary arteries. Note that this exam targets the heart
and the chest was not imaged in its entirety.

[Series 2: calcium scoring 2.00 qr36 bestdiast 69% hrt calciu · axial · 0.30mm/px · z∈[+1646,+1730]mm · 4 of 70 slices shown]
[im 14/70  vessel]
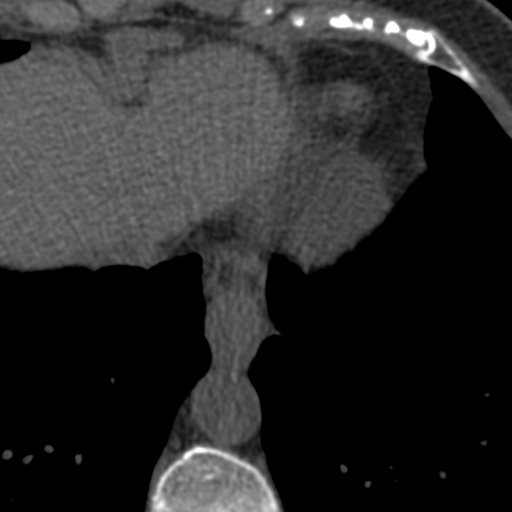
[im 28/70  vessel]
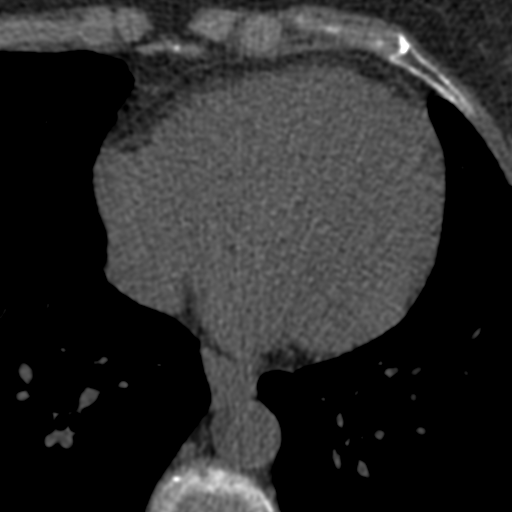
[im 42/70  vessel]
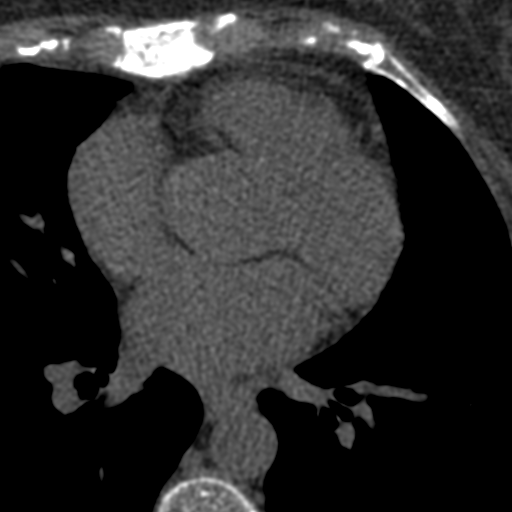
[im 56/70  vessel]
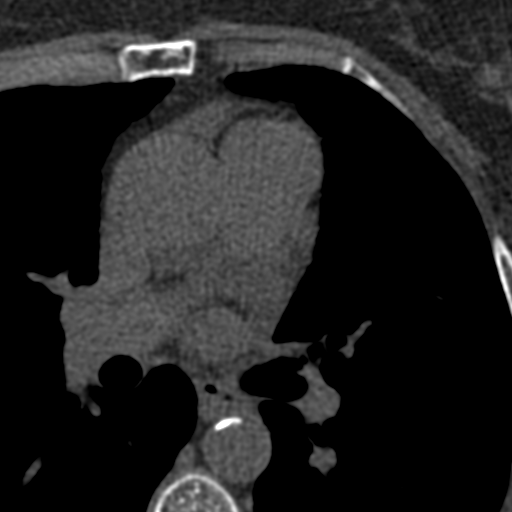

[Series 3: calcium scoring 2.00 br40 bestdiast 69% axial · axial · 0.47mm/px · z∈[+1642,+1734]mm · 5 of 70 slices shown, 7 images]
[im 12/70  vessel]
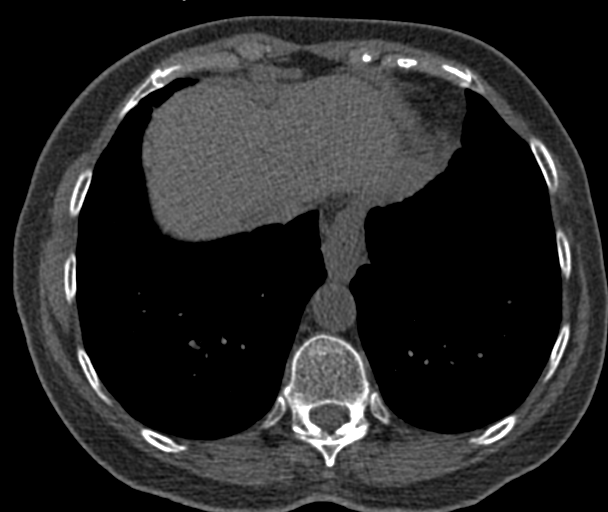
[im 12/70  lung]
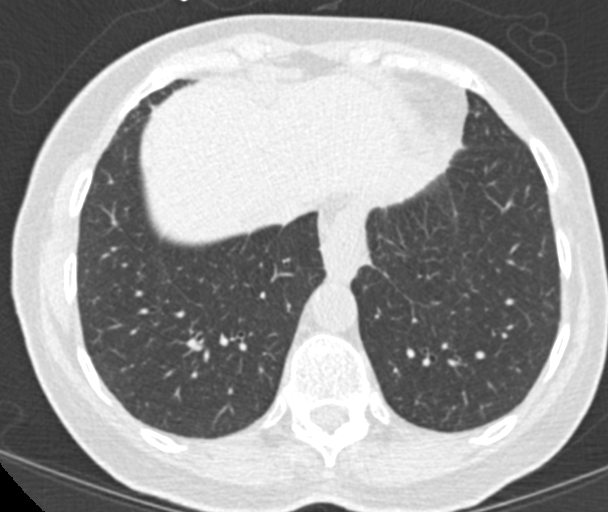
[im 24/70  vessel]
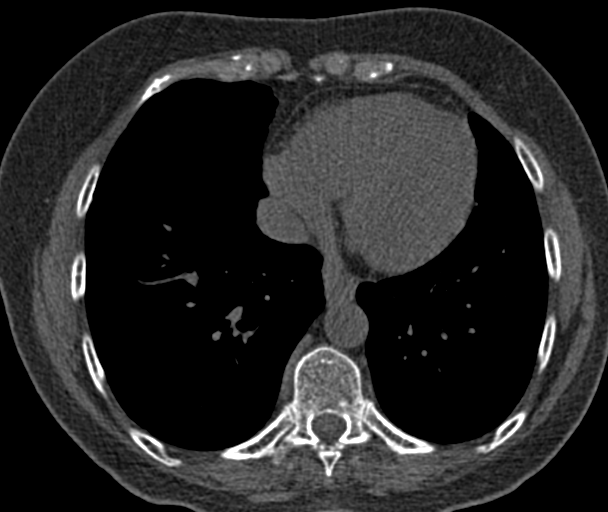
[im 35/70  vessel]
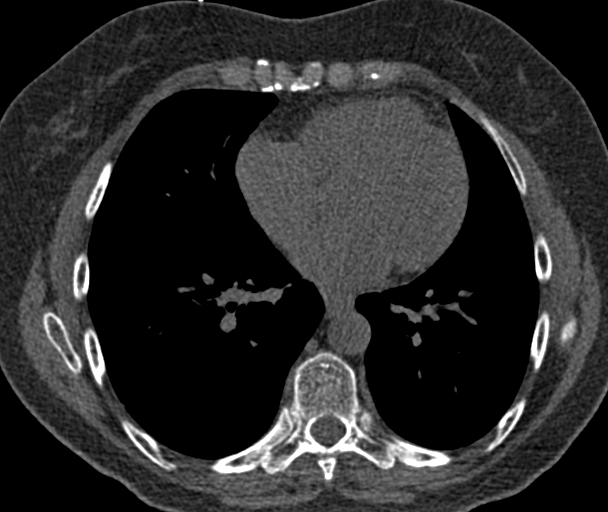
[im 47/70  vessel]
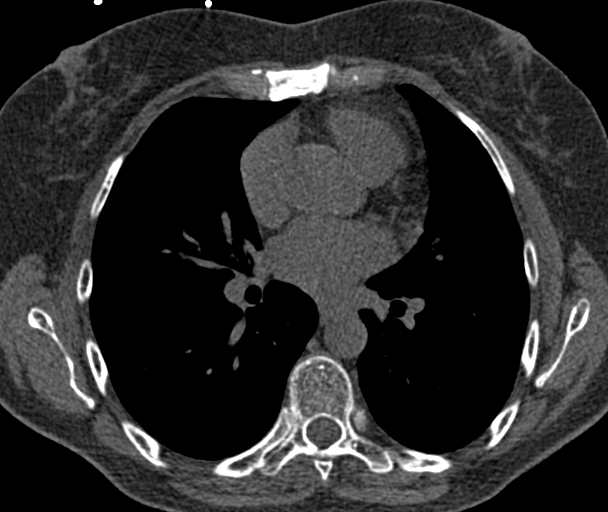
[im 58/70  vessel]
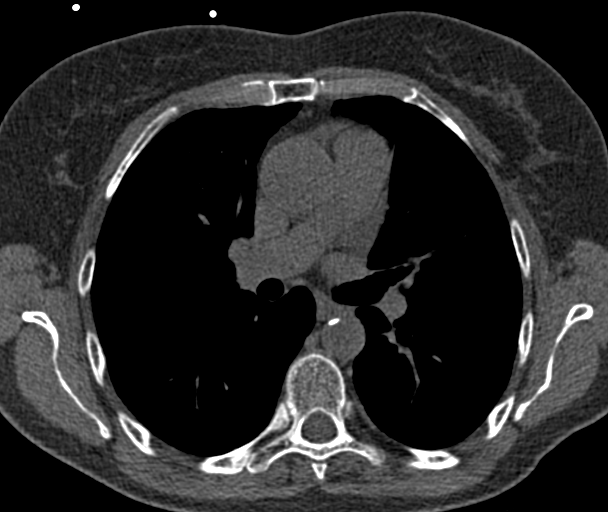
[im 58/70  lung]
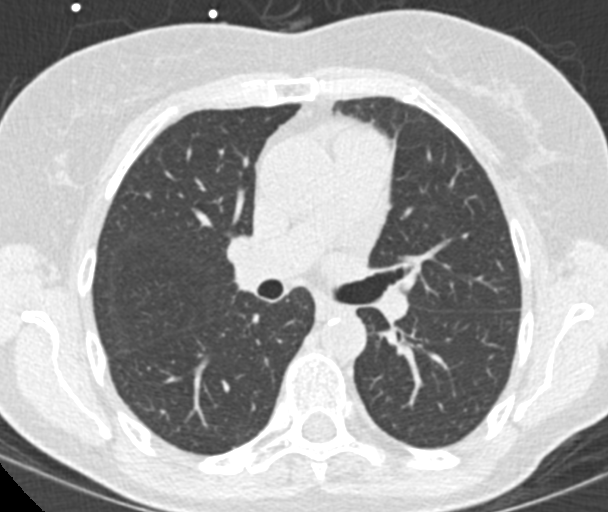

[Series 9: calcium scoring 2.00 br60 bestdiast 69% lungs · axial · 0.47mm/px · z∈[+1642,+1734]mm · 5 of 70 slices shown]
[im 12/70  vessel]
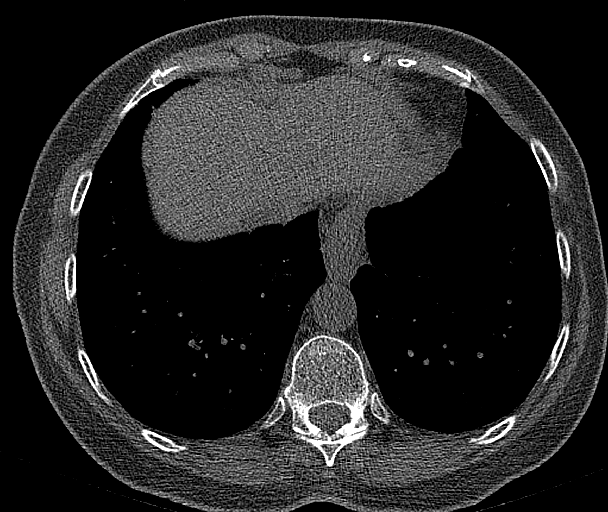
[im 24/70  vessel]
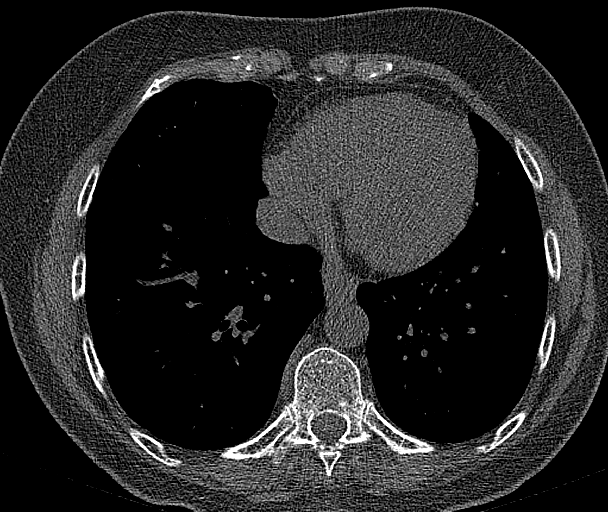
[im 35/70  vessel]
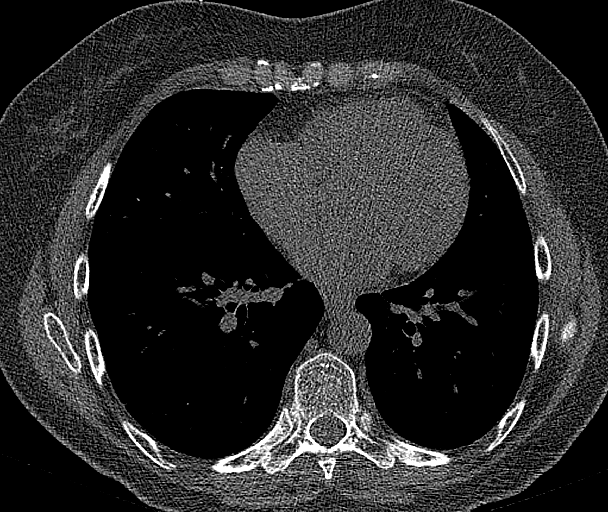
[im 47/70  vessel]
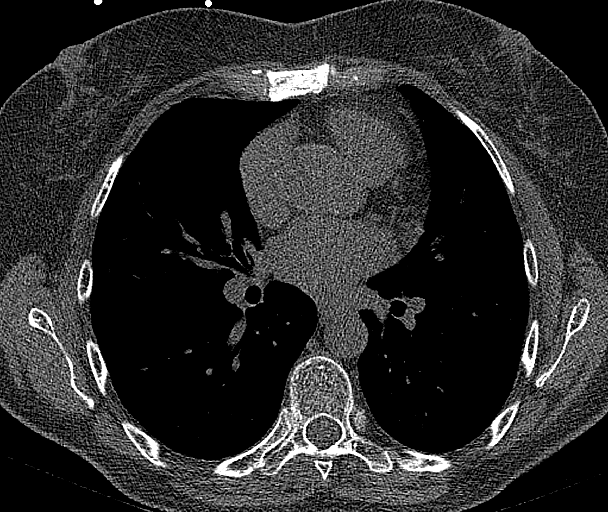
[im 58/70  vessel]
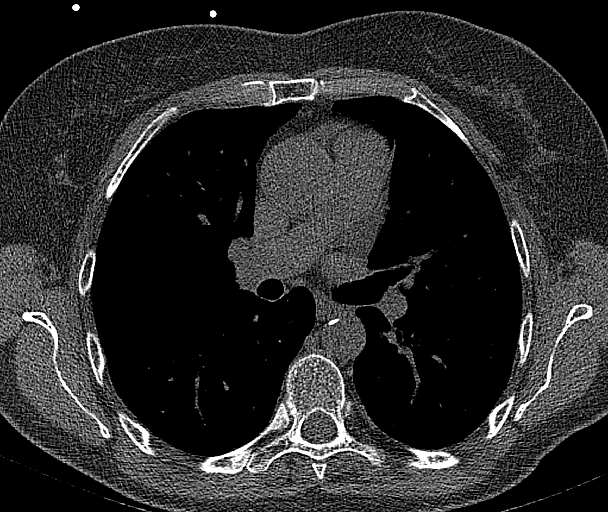

[14 of 20 positions shown; findings below may reference images not displayed]

FINDINGS: CORONARY CALCIUM SCORES:

Left Main: 0

LAD: 0

LCx: 0

RCA: 0

Total Agatston Score: 0

[HOSPITAL] percentile: 0

AORTA MEASUREMENTS:

Ascending Aorta: 34 mm

Descending Aorta: 23 mm

OTHER FINDINGS:

No evidence for coronary artery calcium. Heart size is normal. No
significant pericardial fluid. Probable small hiatal hernia.
Otherwise, the visualized mediastinal structures are unremarkable.
Again noted are small macro calcifications in the left breast. Again
noted is a hypodensity at the hepatic dome which is suggestive for a
cyst. Images of upper abdomen are unremarkable. No large pleural
effusions. Atherosclerotic calcifications involving the descending
thoracic aorta. Visualized lungs are clear. Sclerosis and mild
irregularity of the lower sternum. There was a fracture in this area
and this likely represents posttraumatic changes.
IMPRESSION: 1. Coronary calcium score is 0.
2.  Aortic Atherosclerosis ([id number]).
3. Sclerosis involving the lower sternum. Findings are suggestive
for posttraumatic changes.

## 2023-06-14 DIAGNOSIS — D225 Melanocytic nevi of trunk: Secondary | ICD-10-CM | POA: Diagnosis not present

## 2023-06-14 DIAGNOSIS — Z1283 Encounter for screening for malignant neoplasm of skin: Secondary | ICD-10-CM | POA: Diagnosis not present

## 2023-06-14 DIAGNOSIS — X32XXXD Exposure to sunlight, subsequent encounter: Secondary | ICD-10-CM | POA: Diagnosis not present

## 2023-06-14 DIAGNOSIS — L570 Actinic keratosis: Secondary | ICD-10-CM | POA: Diagnosis not present

## 2023-07-30 DIAGNOSIS — M810 Age-related osteoporosis without current pathological fracture: Secondary | ICD-10-CM | POA: Diagnosis not present

## 2023-08-11 DIAGNOSIS — H524 Presbyopia: Secondary | ICD-10-CM | POA: Diagnosis not present

## 2023-10-25 DIAGNOSIS — E7800 Pure hypercholesterolemia, unspecified: Secondary | ICD-10-CM | POA: Diagnosis not present

## 2023-11-03 DIAGNOSIS — N3001 Acute cystitis with hematuria: Secondary | ICD-10-CM | POA: Diagnosis not present

## 2023-11-03 DIAGNOSIS — I700 Atherosclerosis of aorta: Secondary | ICD-10-CM | POA: Diagnosis not present

## 2023-11-03 DIAGNOSIS — E559 Vitamin D deficiency, unspecified: Secondary | ICD-10-CM | POA: Diagnosis not present

## 2023-11-03 DIAGNOSIS — M810 Age-related osteoporosis without current pathological fracture: Secondary | ICD-10-CM | POA: Diagnosis not present

## 2023-11-03 DIAGNOSIS — I10 Essential (primary) hypertension: Secondary | ICD-10-CM | POA: Diagnosis not present

## 2023-11-03 DIAGNOSIS — E7800 Pure hypercholesterolemia, unspecified: Secondary | ICD-10-CM | POA: Diagnosis not present

## 2024-01-28 DIAGNOSIS — M810 Age-related osteoporosis without current pathological fracture: Secondary | ICD-10-CM | POA: Diagnosis not present

## 2024-04-08 DIAGNOSIS — R030 Elevated blood-pressure reading, without diagnosis of hypertension: Secondary | ICD-10-CM | POA: Diagnosis not present

## 2024-04-08 DIAGNOSIS — M8580 Other specified disorders of bone density and structure, unspecified site: Secondary | ICD-10-CM | POA: Diagnosis not present

## 2024-04-08 DIAGNOSIS — I700 Atherosclerosis of aorta: Secondary | ICD-10-CM | POA: Diagnosis not present

## 2024-04-08 DIAGNOSIS — N182 Chronic kidney disease, stage 2 (mild): Secondary | ICD-10-CM | POA: Diagnosis not present

## 2024-04-08 DIAGNOSIS — E785 Hyperlipidemia, unspecified: Secondary | ICD-10-CM | POA: Diagnosis not present

## 2024-04-08 DIAGNOSIS — K219 Gastro-esophageal reflux disease without esophagitis: Secondary | ICD-10-CM | POA: Diagnosis not present

## 2024-04-08 DIAGNOSIS — Z008 Encounter for other general examination: Secondary | ICD-10-CM | POA: Diagnosis not present

## 2024-04-20 DIAGNOSIS — E7800 Pure hypercholesterolemia, unspecified: Secondary | ICD-10-CM | POA: Diagnosis not present

## 2024-04-20 DIAGNOSIS — M810 Age-related osteoporosis without current pathological fracture: Secondary | ICD-10-CM | POA: Diagnosis not present

## 2024-04-20 DIAGNOSIS — I10 Essential (primary) hypertension: Secondary | ICD-10-CM | POA: Diagnosis not present

## 2024-04-26 DIAGNOSIS — M810 Age-related osteoporosis without current pathological fracture: Secondary | ICD-10-CM | POA: Diagnosis not present

## 2024-04-26 DIAGNOSIS — J309 Allergic rhinitis, unspecified: Secondary | ICD-10-CM | POA: Diagnosis not present

## 2024-04-26 DIAGNOSIS — I700 Atherosclerosis of aorta: Secondary | ICD-10-CM | POA: Diagnosis not present

## 2024-04-26 DIAGNOSIS — M62838 Other muscle spasm: Secondary | ICD-10-CM | POA: Diagnosis not present

## 2024-04-26 DIAGNOSIS — I10 Essential (primary) hypertension: Secondary | ICD-10-CM | POA: Diagnosis not present

## 2024-04-26 DIAGNOSIS — R350 Frequency of micturition: Secondary | ICD-10-CM | POA: Diagnosis not present

## 2024-04-26 DIAGNOSIS — E559 Vitamin D deficiency, unspecified: Secondary | ICD-10-CM | POA: Diagnosis not present

## 2024-04-26 DIAGNOSIS — K219 Gastro-esophageal reflux disease without esophagitis: Secondary | ICD-10-CM | POA: Diagnosis not present

## 2024-04-26 DIAGNOSIS — Z0000 Encounter for general adult medical examination without abnormal findings: Secondary | ICD-10-CM | POA: Diagnosis not present

## 2024-07-31 DIAGNOSIS — M810 Age-related osteoporosis without current pathological fracture: Secondary | ICD-10-CM | POA: Diagnosis not present

## 2024-08-02 DIAGNOSIS — D225 Melanocytic nevi of trunk: Secondary | ICD-10-CM | POA: Diagnosis not present

## 2024-08-02 DIAGNOSIS — L821 Other seborrheic keratosis: Secondary | ICD-10-CM | POA: Diagnosis not present

## 2024-08-02 DIAGNOSIS — L814 Other melanin hyperpigmentation: Secondary | ICD-10-CM | POA: Diagnosis not present

## 2024-08-02 DIAGNOSIS — L570 Actinic keratosis: Secondary | ICD-10-CM | POA: Diagnosis not present
